# Patient Record
Sex: Female | Born: 1995 | Race: Black or African American | Hispanic: No | Marital: Single | Smoking: Current every day smoker
Health system: Southern US, Community
[De-identification: ages and names within clinical notes are randomized; demographics above are authoritative.]

---
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
## 2017-02-12 ENCOUNTER — Emergency Department (HOSPITAL_COMMUNITY): Payer: Self-pay

## 2017-02-12 ENCOUNTER — Emergency Department (HOSPITAL_COMMUNITY)
Admission: EM | Admit: 2017-02-12 | Discharge: 2017-02-12 | Disposition: A | Discharge status: Home / Self Care | Payer: Self-pay | Attending: Emergency Medicine | Admitting: Emergency Medicine

## 2017-02-12 ENCOUNTER — Encounter (HOSPITAL_COMMUNITY): Payer: Self-pay | Admitting: Emergency Medicine

## 2017-02-12 DIAGNOSIS — W108XXA Fall (on) (from) other stairs and steps, initial encounter: Secondary | ICD-10-CM | POA: Insufficient documentation

## 2017-02-12 DIAGNOSIS — Y999 Unspecified external cause status: Secondary | ICD-10-CM | POA: Insufficient documentation

## 2017-02-12 DIAGNOSIS — Y939 Activity, unspecified: Secondary | ICD-10-CM | POA: Insufficient documentation

## 2017-02-12 DIAGNOSIS — Y929 Unspecified place or not applicable: Secondary | ICD-10-CM | POA: Insufficient documentation

## 2017-02-12 DIAGNOSIS — S93401A Sprain of unspecified ligament of right ankle, initial encounter: Secondary | ICD-10-CM | POA: Insufficient documentation

## 2017-02-12 MED ORDER — IBUPROFEN 600 MG PO TABS: 600.0000 mg | ORAL_TABLET | Freq: Four times a day (QID) | ORAL | 0 refills | Status: DC

## 2017-02-12 MED ORDER — IBUPROFEN 800 MG PO TABS
800.0000 mg | ORAL_TABLET | Freq: Once | ORAL | Status: AC
  Administered 2017-02-12: 800 mg via ORAL
  Filled 2017-02-12: qty 1

## 2017-02-12 MED ORDER — ACETAMINOPHEN 325 MG PO TABS
650.0000 mg | ORAL_TABLET | Freq: Once | ORAL | Status: AC
  Administered 2017-02-12: 650 mg via ORAL
  Filled 2017-02-12: qty 2

## 2017-02-12 NOTE — ED Triage Notes (Signed)
Pt states she slipped down steps earlier today and now c/o right ankle pain.

## 2017-02-12 NOTE — ED Notes (Signed)
Pt returned from xray

## 2017-02-12 NOTE — ED Provider Notes (Signed)
[facility]-EMERGENCY DEPT Provider Note   CSN: [id number] Arrival date & time: 02/12/17  2004   By signing my name below, I, Bobbie Stack, attest that this documentation has been prepared under the direction and in the presence of Ivery Quale, PA-C. Electronically Signed: Bobbie Stack, Scribe. 02/12/17. 10:18 PM. History   Chief Complaint Chief Complaint  Patient presents with  . Ankle Pain    The history is provided by the patient. No language interpreter was used.  HPI Comments: Veronica Watts is a 21 y.o. female who presents to the Emergency Department complaining of right ankle pain s/p fall that occurred around 3 pm today. She states that she slipped down some steps and twisted her right ankle at that time. She reports pain with movement of her ankle. She is not on any blood thinners currently. She denies any recent surgeries or injuries to this ankle prior to today.   History reviewed. No pertinent past medical history.  There are no active problems to display for this patient.   History reviewed. No pertinent surgical history.  OB History    No data available       Home Medications    Prior to Admission medications   Medication Sig Start Date End Date Taking? Authorizing Provider  fluticasone (FLONASE) 50 MCG/ACT nasal spray Place 2 sprays into both nostrils daily. 11/24/13   Kathrynn Speed, PA-C  guaiFENesin (MUCINEX) 600 MG 12 hr tablet Take 2 tablets (1,200 mg total) by mouth 2 (two) times daily. 11/24/13   Kathrynn Speed, PA-C  ibuprofen (ADVIL,MOTRIN) 600 MG tablet Take 1 tablet (600 mg total) by mouth every 6 (six) hours as needed for pain. 06/21/13   Devoria Albe, MD  methocarbamol (ROBAXIN) 500 MG tablet Take 1 or 2 po Q 6hrs for chest wall pain 06/22/13   Devoria Albe, MD    Family History History reviewed. No pertinent family history.  Social History Social History  Substance Use Topics  . Smoking status: Never Smoker  . Smokeless tobacco: Never Used  .  Alcohol use No     Allergies   Patient has no known allergies.   Review of Systems Review of Systems  Constitutional: Negative for fever.  Gastrointestinal: Negative for abdominal pain, nausea and vomiting.  Musculoskeletal: Positive for arthralgias.       Right ankle pain  All other systems reviewed and are negative.    Physical Exam Updated Vital Signs BP (!) 121/53   Pulse 85   Temp 99.1 F (37.3 C)   Resp 18   Ht  (1.702 m)   Wt (!) 310 lb (140.6 kg)   LMP 02/12/2017   SpO2 96%   BMI 48.55 kg/m   Physical Exam  Constitutional: She is oriented to person, place, and time. She appears well-developed and well-nourished.  HENT:  Head: Normocephalic.  Eyes: EOM are normal.  Neck: Normal range of motion.  Pulmonary/Chest: Effort normal.  Abdominal: She exhibits no distension.  Musculoskeletal: Normal range of motion. She exhibits tenderness.  Capillary refill is < 2 seconds. Radial pulse 2+.  Achilles tendon intact. Swelling of lateral malleolus. Pain with flexion and extension of the right ankle. No deforemity of the calf or lower portion of the leg.  Neurological: She is alert and oriented to person, place, and time.  Psychiatric: She has a normal mood and affect.  Nursing note and vitals reviewed.   ED Treatments / Results  DIAGNOSTIC STUDIES: Oxygen Saturation is 96% on RA,  adequate by my interpretation.    COORDINATION OF CARE: 10:10 PM Discussed treatment plan with pt at bedside and pt agreed to plan. I discussed the patient's x-ray with her. I advised the patient to put ice on her ankle and elevate her ankle above her waistline.  Labs (all labs ordered are listed, but only abnormal results are displayed) Labs Reviewed - No data to display  EKG  EKG Interpretation None       Radiology Dg Ankle Complete Right  Result Date: 02/12/2017 CLINICAL DATA:  Status post fall, landing on right ankle. Right lateral ankle pain and swelling. Initial  encounter. EXAM: RIGHT ANKLE - COMPLETE 3+ VIEW COMPARISON:  None. FINDINGS: There is no evidence of fracture or dislocation. The ankle mortise is intact; the interosseous space is within normal limits. No talar tilt or subluxation is seen. An ankle joint effusion is noted. Lateral soft tissue swelling is noted. IMPRESSION: 1. No evidence of fracture or dislocation. 2. Ankle joint effusion noted. Electronically Signed   By: Roanna Raider M.D.   On: 02/12/2017 20:56    Procedures Procedures (including critical care time)  Medications Ordered in ED Medications - No data to display   Initial Impression / Assessment and Plan / ED Course  I have reviewed the triage vital signs and the nursing notes.  Pertinent labs & imaging results that were available during my care of the patient were reviewed by me and considered in my medical decision making (see chart for details).       Final Clinical Impressions(s) / ED Diagnoses MDM Patient sustained injury to the right ankle earlier today. She has pain and swelling that is worse when she attempts to walk on it. X-ray of the right ankle shows an ankle joint effusion, but no evidence of fracture or dislocation. Suspect the patient has a severe ankle sprain. Patient fitted with an ankle stirrup splint. Ice pack is been given. The patient will use ibuprofen and Tylenol every 6 hours for pain and discomfort. The patient will see Dr. Romeo Apple for additional evaluation if not improving.    Final diagnoses:  Sprain of right ankle, unspecified ligament, initial encounter    New Prescriptions New Prescriptions   No medications on file   **I personally performed the services described in this documentation, which was scribed in my presence. The recorded information has been reviewed and is accurate.Ivery Quale, PA-C 02/12/17 2239    Samuel Jester, DO 02/17/17 1120

## 2017-02-12 NOTE — Discharge Instructions (Signed)
Your x-ray is negative for fracture or dislocation. Your examination suggests a sprain of the right ankle. Please keep your ankle elevated above your waist is much as possible. Apply ice. Use  of Ibuprofen and  of tylenol every 6 hours for pain and discomfort. Use the ankle splint when up and about over the next 7-10 days. Please see Dr. Romeo Apple for orthopedic evaluation and management if not improving.

## 2017-05-15 ENCOUNTER — Encounter: Payer: Self-pay | Admitting: Obstetrics and Gynecology

## 2017-05-15 ENCOUNTER — Ambulatory Visit (INDEPENDENT_AMBULATORY_CARE_PROVIDER_SITE_OTHER): Payer: No Typology Code available for payment source | Admitting: Obstetrics and Gynecology

## 2017-05-15 DIAGNOSIS — N921 Excessive and frequent menstruation with irregular cycle: Secondary | ICD-10-CM

## 2017-05-15 DIAGNOSIS — Z3046 Encounter for surveillance of implantable subdermal contraceptive: Secondary | ICD-10-CM

## 2017-05-15 DIAGNOSIS — Z975 Presence of (intrauterine) contraceptive device: Principal | ICD-10-CM

## 2017-05-15 MED ORDER — ESTRADIOL 0.1 MG/GM VA CREA: 0.2500 | TOPICAL_CREAM | VAGINAL | 3 refills | Status: DC

## 2017-05-15 MED ORDER — ESTRADIOL 1 MG PO TABS: 1.0000 mg | ORAL_TABLET | Freq: Two times a day (BID) | ORAL | 2 refills | Status: DC

## 2017-05-15 NOTE — Progress Notes (Signed)
   [facility] Visit  05/15/2017            Patient name: Veronica Watts MRN [id number]  Date of birth: [birth year]  CC & HPI:  Veronica Watts is a 21 y.o. female presenting today for heavy bleeding on nexplanon BC with onset of recently. Pt [facility], with no bleeding. Pt states she has heavy bleeding that begins when she goes to work, and it stops when she comes homes. Pt experiences similar bleeding occasionally after sexual intercourse.   ROS:  ROS +heavy vaginal bleeding -fever  Pertinent History Reviewed:   Reviewed:  Medical        History reviewed. No pertinent past medical history.                            Surgical Hx:   History reviewed. No pertinent surgical history. Medications: Reviewed & Updated - see associated section                      No current outpatient prescriptions on file.   Social History: Reviewed -  reports that she has never smoked. She has never used smokeless tobacco.  Objective Findings:  Vitals: Blood pressure 132/78, pulse 78, height 5\' 8"  (1.727 m), weight (!) 347 lb (157.4 kg), last menstrual period 04/11/2017.  Physical Examination: General appearance - alert, well appearing, and in no distress Mental status - alert, oriented to person, place, and time Pelvic -  VULVA: normal appearing vulva with no masses, tenderness or lesions,  VAGINA: normal appearing vagina with normal color and discharge, no lesions,  CERVIX: normal appearing cervix without discharge or lesions, no irritation UTERUS: uterus is normal size, shape, consistency and nontender,  ADNEXA: normal adnexa in size, nontender and no masses   Assessment & Plan:   A:  1. Breakthrough bleeding on Nexplanon  P:  1. Estradiol 1 mg 2x/day while spotting, take until spotting stops 2. F/u in 3 months    By signing my name below, I, Izna Ahmed, attest that this documentation has been prepared under the direction and in the presence of  Tilda BurrowFerguson, Wetzel Meester V, MD. Electronically Signed: Redge GainerIzna Ahmed, ED Scribe. 05/15/17. 2:30 PM.  I personally performed the services described in this documentation, which was SCRIBED in my presence. The recorded information has been reviewed and considered accurate. It has been edited as necessary during review. Tilda BurrowFERGUSON,Eulamae Greenstein V, MD

## 2017-06-15 ENCOUNTER — Emergency Department (HOSPITAL_COMMUNITY)
Admission: EM | Admit: 2017-06-15 | Discharge: 2017-06-16 | Disposition: A | Discharge status: Home / Self Care | Payer: PRIVATE HEALTH INSURANCE | Attending: Emergency Medicine | Admitting: Emergency Medicine

## 2017-06-15 ENCOUNTER — Encounter (HOSPITAL_COMMUNITY): Payer: Self-pay | Admitting: *Deleted

## 2017-06-15 ENCOUNTER — Emergency Department (HOSPITAL_COMMUNITY): Payer: PRIVATE HEALTH INSURANCE

## 2017-06-15 DIAGNOSIS — Y929 Unspecified place or not applicable: Secondary | ICD-10-CM | POA: Diagnosis not present

## 2017-06-15 DIAGNOSIS — Y939 Activity, unspecified: Secondary | ICD-10-CM | POA: Diagnosis not present

## 2017-06-15 DIAGNOSIS — S93402A Sprain of unspecified ligament of left ankle, initial encounter: Secondary | ICD-10-CM | POA: Diagnosis not present

## 2017-06-15 DIAGNOSIS — S99912A Unspecified injury of left ankle, initial encounter: Secondary | ICD-10-CM | POA: Diagnosis present

## 2017-06-15 DIAGNOSIS — W010XXA Fall on same level from slipping, tripping and stumbling without subsequent striking against object, initial encounter: Secondary | ICD-10-CM | POA: Insufficient documentation

## 2017-06-15 DIAGNOSIS — Y999 Unspecified external cause status: Secondary | ICD-10-CM | POA: Diagnosis not present

## 2017-06-15 MED ORDER — IBUPROFEN 600 MG PO TABS: 600.0000 mg | ORAL_TABLET | Freq: Four times a day (QID) | ORAL | 0 refills | Status: DC | PRN

## 2017-06-15 NOTE — Discharge Instructions (Signed)
Wear the ASO and use crutches to avoid weight bearing.  Use ice and elevation as much as possible for the next several days to help reduce the swelling.   Use the ibuprofen for inflammation and pain.  Call the orthopedic doctor listed for a recheck of your injury if not improving over the next 2 weeks.  You may benefit from physical therapy of your ankle if it is not improving by then.

## 2017-06-15 NOTE — ED Triage Notes (Signed)
Pt reports falling down 2 steps and injuring her left ankle and landed on her left knee. Pt denies any other injury.

## 2017-06-16 DIAGNOSIS — S93402A Sprain of unspecified ligament of left ankle, initial encounter: Secondary | ICD-10-CM | POA: Diagnosis not present

## 2017-06-16 MED ORDER — TRAMADOL HCL 50 MG PO TABS: 50.0000 mg | ORAL_TABLET | Freq: Four times a day (QID) | ORAL | 0 refills | Status: DC | PRN

## 2017-06-16 MED ORDER — TRAMADOL HCL 50 MG PO TABS
50.0000 mg | ORAL_TABLET | Freq: Once | ORAL | Status: AC
  Administered 2017-06-16: 50 mg via ORAL
  Filled 2017-06-16: qty 1

## 2017-06-16 NOTE — ED Provider Notes (Signed)
[facility]-EMERGENCY DEPT Provider Note   CSN: [id number] Arrival date & time: 06/15/17  2141     History   Chief Complaint Chief Complaint  Patient presents with  . Ankle Pain    HPI Veronica Watts is a 20 y.o. female presenting with acute ankle pain which occurred suddenly when the patient tripped and fell going down 2 steps causing inversion injury to her left ankle and landing on her knee.  She denies knee pain.  Ankle  pain is aching, constant and worse with palpation, movement and weight bearing.  The patient was not able to weight bear immediately after the event.  There is no radiation of pain and the patient denies numbness distal to the injury site.  The patient had no treatment prior to arrival.   The history is provided by the patient.    History reviewed. No pertinent past medical history.  Patient Active Problem List   Diagnosis Date Noted  . Breakthrough bleeding on Nexplanon 05/15/2017    History reviewed. No pertinent surgical history.  OB History    Gravida Para Term Preterm AB Living   0 0 0 0 0 0   SAB TAB Ectopic Multiple Live Births   0 0 0 0 0       Home Medications    Prior to Admission medications   Medication Sig Start Date End Date Taking? Authorizing Provider  estradiol (ESTRACE) 1 MG tablet Take 1 tablet (1 mg total) by mouth 2 (two) times daily. On days of spotting on Nexplanon 05/15/17 05/15/18 Yes Tilda Burrow, MD  etonogestrel (NEXPLANON) 68 MG IMPL implant 1 each by Subdermal route once.   Yes [provider]  ibuprofen (ADVIL,MOTRIN) 600 MG tablet Take 1 tablet (600 mg total) by mouth every 6 (six) hours as needed. 06/15/17   Burgess Amor, PA-C  traMADol (ULTRAM) 50 MG tablet Take 1 tablet (50 mg total) by mouth every 6 (six) hours as needed. 06/16/17   Burgess Amor, PA-C    Family History Family History  Problem Relation Age of Onset  . Diabetes Paternal Grandmother   . Diabetes Maternal Grandmother   . Cancer Maternal  Grandmother        throat  . Diabetes Father     Social History Social History  Substance Use Topics  . Smoking status: Never Smoker  . Smokeless tobacco: Never Used  . Alcohol use No     Allergies   Patient has no known allergies.   Review of Systems Review of Systems  Musculoskeletal: Positive for arthralgias and joint swelling.  Skin: Negative for wound.  Neurological: Negative for weakness and numbness.     Physical Exam Updated Vital Signs BP 114/68 (BP Location: Left Arm)   Pulse 86   Temp 99.3 F (37.4 C) (Oral)   Resp 18   Ht 5\' 7"  (1.702 m)   Wt (!) 160.6 kg (354 lb 1 oz)   LMP 06/13/2017   SpO2 100%   BMI 55.45 kg/m   Physical Exam  Constitutional: She appears well-developed and well-nourished.  HENT:  Head: Normocephalic.  Cardiovascular: Normal rate and intact distal pulses.  Exam reveals no decreased pulses.   Pulses:      Dorsalis pedis pulses are 2+ on the right side, and 2+ on the left side.       Posterior tibial pulses are 2+ on the right side, and 2+ on the left side.  Musculoskeletal: She exhibits edema and tenderness.  Left ankle: She exhibits decreased range of motion, swelling and ecchymosis. She exhibits no deformity and normal pulse. Tenderness. Lateral malleolus tenderness found. No head of 5th metatarsal and no proximal fibula tenderness found. Achilles tendon normal.  Neurological: She is alert. No sensory deficit.  Skin: Skin is warm, dry and intact.  Nursing note and vitals reviewed.    ED Treatments / Results  Labs (all labs ordered are listed, but only abnormal results are displayed) Labs Reviewed - No data to display  EKG  EKG Interpretation None       Radiology Dg Ankle Complete Left  Result Date: 06/15/2017 CLINICAL DATA:  Left ankle pain after injury.  Fall down steps. EXAM: LEFT ANKLE COMPLETE - 3+ VIEW COMPARISON:  None. FINDINGS: There is no evidence of fracture, dislocation, or joint effusion. Ankle  mortise is preserved. There is no evidence of arthropathy or other focal bone abnormality. Soft tissue edema versus habitus. IMPRESSION: No fracture or subluxation of the left ankle. Electronically Signed   By: Rubye Oaks M.D.   On: 06/15/2017 22:56    Procedures Procedures (including critical care time)  Medications Ordered in ED Medications  traMADol (ULTRAM) tablet 50 mg (50 mg Oral Given 06/16/17 0028)     Initial Impression / Assessment and Plan / ED Course  I have reviewed the triage vital signs and the nursing notes.  Pertinent labs & imaging results that were available during my care of the patient were reviewed by me and considered in my medical decision making (see chart for details).     RICE, aso, crutches.  Pt referred to ortho for a recheck in 1-2 weeks if not improving. Discussed ice tx initially followed by contrast baths, aso, prn crutches.   Final Clinical Impressions(s) / ED Diagnoses   Final diagnoses:  Sprain of left ankle, unspecified ligament, initial encounter    New Prescriptions New Prescriptions   IBUPROFEN (ADVIL,MOTRIN) 600 MG TABLET    Take 1 tablet (600 mg total) by mouth every 6 (six) hours as needed.   TRAMADOL (ULTRAM) 50 MG TABLET    Take 1 tablet (50 mg total) by mouth every 6 (six) hours as needed.     Burgess Amor, PA-C 06/16/17 0038    Samuel Jester, DO 06/16/17 2255

## 2017-06-25 ENCOUNTER — Ambulatory Visit (INDEPENDENT_AMBULATORY_CARE_PROVIDER_SITE_OTHER): Payer: PRIVATE HEALTH INSURANCE | Admitting: Orthopaedic Surgery

## 2017-06-25 ENCOUNTER — Encounter: Payer: Self-pay | Admitting: Orthopaedic Surgery

## 2017-06-25 VITALS — BP 116/76 | HR 77 | Temp 98.8°F | Ht 66.0 in | Wt 347.0 lb

## 2017-06-25 DIAGNOSIS — S96912A Strain of unspecified muscle and tendon at ankle and foot level, left foot, initial encounter: Secondary | ICD-10-CM | POA: Diagnosis not present

## 2017-06-25 NOTE — Progress Notes (Signed)
Subjective:    Patient ID: Veronica Watts, female    DOB: 1996/04/18, 21 y.o.   MRN: [id number]  HPI She twisted her left ankle on 06-15-17. She was seen in the ER.  X-rays were negative for fracture. She has ankle brace in place.  She has no other injury.  She is a little better.  She is working.  She has taken Advil and used ice both which help.   Review of Systems  HENT: Negative for congestion.   Respiratory: Negative for cough and shortness of breath.   Cardiovascular: Negative for chest pain and leg swelling.  Endocrine: Negative for cold intolerance.  Musculoskeletal: Positive for arthralgias, gait problem and joint swelling.  Allergic/Immunologic: Negative for environmental allergies.   History reviewed. No pertinent past medical history.  History reviewed. No pertinent surgical history.  Current Outpatient Prescriptions on File Prior to Visit  Medication Sig Dispense Refill  . estradiol (ESTRACE) 1 MG tablet Take 1 tablet (1 mg total) by mouth 2 (two) times daily. On days of spotting on Nexplanon 30 tablet 2  . etonogestrel (NEXPLANON) 68 MG IMPL implant 1 each by Subdermal route once.    Marland Kitchen ibuprofen (ADVIL,MOTRIN) 600 MG tablet Take 1 tablet (600 mg total) by mouth every 6 (six) hours as needed. 30 tablet 0  . traMADol (ULTRAM) 50 MG tablet Take 1 tablet (50 mg total) by mouth every 6 (six) hours as needed. 15 tablet 0   No current facility-administered medications on file prior to visit.     Social History   Social History  . Marital status: Single    Spouse name: N/A  . Number of children: N/A  . Years of education: N/A   Occupational History  . Not on file.   Social History Main Topics  . Smoking status: Never Smoker  . Smokeless tobacco: Never Used  . Alcohol use No  . Drug use: No  . Sexual activity: Yes    Birth control/ protection: Implant   Other Topics Concern  . Not on file   Social History Narrative  . No narrative on file    Family  History  Problem Relation Age of Onset  . Diabetes Paternal Grandmother   . Diabetes Maternal Grandmother   . Cancer Maternal Grandmother        throat  . Diabetes Father     BP 116/76   Pulse 77   Temp 98.8 F (37.1 C)   Ht 5\' 6"  (1.676 m)   Wt (!) 347 lb (157.4 kg)   LMP 06/13/2017   BMI 56.01 kg/m      Objective:   Physical Exam  Constitutional: She is oriented to person, place, and time. She appears well-developed and well-nourished.  HENT:  Head: Normocephalic and atraumatic.  Eyes: Pupils are equal, round, and reactive to light. Conjunctivae and EOM are normal.  Neck: Normal range of motion. Neck supple.  Cardiovascular: Normal rate, regular rhythm and intact distal pulses.   Pulmonary/Chest: Effort normal.  Abdominal: Soft.  Musculoskeletal: She exhibits tenderness (left ankle swelling laterally with pain over the anterior talofibular ligament, ROM full but tender, limp left, brace left; right ankle negative.).  Neurological: She is alert and oriented to person, place, and time. She displays normal reflexes. No cranial nerve deficit. She exhibits normal muscle tone. Coordination normal.  Skin: Skin is warm and dry.  Psychiatric: She has a normal mood and affect. Her behavior is normal. Judgment and thought content normal.  Vitals  reviewed.         Assessment & Plan:   Encounter Diagnosis  Name Primary?  . Strain of left ankle, initial encounter Yes   I have reviewed her ER records, X-rays and reports.    I have given contrast bath sheet of instructions to do.  Use ice, elevate, continue ankle brace.  Return in two weeks.  Call if any problem.  Precautions discussed.   Electronically Signed Darreld McleanWayne Donold Marotto, MD 9/4/20182:50 PM

## 2017-07-10 ENCOUNTER — Ambulatory Visit: Payer: PRIVATE HEALTH INSURANCE | Admitting: Orthopaedic Surgery

## 2017-08-12 ENCOUNTER — Ambulatory Visit (INDEPENDENT_AMBULATORY_CARE_PROVIDER_SITE_OTHER): Payer: Medicaid Other | Admitting: Obstetrics and Gynecology

## 2017-08-12 ENCOUNTER — Encounter: Payer: Self-pay | Admitting: Obstetrics and Gynecology

## 2017-08-12 VITALS — BP 112/62 | HR 82 | Ht 67.0 in | Wt 355.0 lb

## 2017-08-12 DIAGNOSIS — Z3049 Encounter for surveillance of other contraceptives: Secondary | ICD-10-CM | POA: Diagnosis not present

## 2017-08-12 DIAGNOSIS — Z3040 Encounter for surveillance of contraceptives, unspecified: Secondary | ICD-10-CM | POA: Insufficient documentation

## 2017-08-12 NOTE — Progress Notes (Signed)
Patient ID: Veronica Watts, female   DOB: 1995-11-07, 21 y.o.   MRN: [id number]   [facility]  @[facility] @            Patient name: Veronica BachelorDeasia S Watts MRN [id number]  Date of birth: 1995-11-07  CC & HPI:  Veronica Watts is a 21 y.o. female presenting today for f/u regarding breakthrough bleeding on Nexplanon. She reports that the blood has become darker and she was concerned of a possible infection. Pt has been sexually active with a female for the last year. She has mainly been using the Nexplanon for period control. Pt notes that her period has been under control. Pt denies any fever, chills or any other symptoms at time time.  ROS:  ROS +dark red vaginal bleeding -fever -chills All systems are negative except as noted in the HPI and PMH.   Pertinent History Reviewed:   Reviewed:  Medical        History reviewed. No pertinent past medical history.                            Surgical Hx:   History reviewed. No pertinent surgical history. Medications: Reviewed & Updated - see associated section                       Current Outpatient Prescriptions:  .  estradiol (ESTRACE) 1 MG tablet, Take 1 tablet (1 mg total) by mouth 2 (two) times daily. On days of spotting on Nexplanon, Disp: 30 tablet, Rfl: 2 .  etonogestrel (NEXPLANON) 68 MG IMPL implant, 1 each by Subdermal route once., Disp: , Rfl:  .  ibuprofen (ADVIL,MOTRIN) 600 MG tablet, Take 1 tablet (600 mg total) by mouth every 6 (six) hours as needed., Disp: 30 tablet, Rfl: 0 .  traMADol (ULTRAM) 50 MG tablet, Take 1 tablet (50 mg total) by mouth every 6 (six) hours as needed. (Patient not taking: Reported on 08/12/2017), Disp: 15 tablet, Rfl: 0   Social History: Reviewed -  reports that she has never smoked. She has never used smokeless tobacco.  Objective Findings:  Vitals: Blood pressure 112/62, pulse 82, height 5\' 7"  (1.702 m), weight (!) 355 lb (161 kg), SpO2 98 %.  Physical Examination: General appearance -  alert, well appearing, and in no distress, oriented to person, place, and time and overweight, piercing in place in left nostril. Mental status - alert, oriented to person, place, and time, normal mood, behavior, speech, dress, motor activity, and thought processes, affect appropriate to mood Abdomen- non tender Breast and Pelvic - declined by patient  Assessment & Plan:   A:  1. Contraception management 2. Normal menstrual cycle on Nexplanon  P:  1. F/U PRN   By signing my name below, I, Diona BrownerJennifer Gorman, attest that this documentation has been prepared under the direction and in the presence of Tilda BurrowFerguson, Calyb Mcquarrie V, MD. Electronically Signed: Diona BrownerJennifer Gorman, Medical Scribe. 08/12/17. 3:57 PM.  I personally performed the services described in this documentation, which was SCRIBED in my presence. The recorded information has been reviewed and considered accurate. It has been edited as necessary during review. Tilda BurrowFERGUSON,Shenique Childers V, MD

## 2017-08-15 ENCOUNTER — Ambulatory Visit: Payer: No Typology Code available for payment source | Admitting: Obstetrics and Gynecology

## 2018-07-17 ENCOUNTER — Emergency Department (HOSPITAL_COMMUNITY): Payer: Self-pay

## 2018-07-17 ENCOUNTER — Other Ambulatory Visit: Payer: Self-pay

## 2018-07-17 ENCOUNTER — Encounter (HOSPITAL_COMMUNITY): Payer: Self-pay | Admitting: Emergency Medicine

## 2018-07-17 ENCOUNTER — Emergency Department (HOSPITAL_COMMUNITY)
Admission: EM | Admit: 2018-07-17 | Discharge: 2018-07-17 | Disposition: A | Discharge status: Home / Self Care | Payer: Self-pay | Attending: Emergency Medicine | Admitting: Emergency Medicine

## 2018-07-17 DIAGNOSIS — N762 Acute vulvitis: Secondary | ICD-10-CM | POA: Insufficient documentation

## 2018-07-17 DIAGNOSIS — R102 Pelvic and perineal pain: Secondary | ICD-10-CM | POA: Insufficient documentation

## 2018-07-17 DIAGNOSIS — B9689 Other specified bacterial agents as the cause of diseases classified elsewhere: Secondary | ICD-10-CM

## 2018-07-17 DIAGNOSIS — N760 Acute vaginitis: Secondary | ICD-10-CM

## 2018-07-17 LAB — BASIC METABOLIC PANEL
Anion gap: 6 (ref 5–15)
BUN: 7 mg/dL (ref 6–20)
CALCIUM: 8.8 mg/dL — AB (ref 8.9–10.3)
CO2: 26 mmol/L (ref 22–32)
CREATININE: 0.57 mg/dL (ref 0.44–1.00)
Chloride: 101 mmol/L (ref 98–111)
GFR calc non Af Amer: >60 mL/min (ref >60)
Glucose, Bld: 92 mg/dL (ref 70–99)
Potassium: 3.6 mmol/L (ref 3.5–5.1)
SODIUM: 133 mmol/L — AB (ref 135–145)

## 2018-07-17 LAB — CBC WITH DIFFERENTIAL/PLATELET
Basophils Absolute: 0 10*3/uL (ref 0.0–0.1)
Basophils Relative: 0 %
EOS ABS: 0.1 10*3/uL (ref 0.0–0.7)
EOS PCT: 1 %
HCT: 38.9 % (ref 36.0–46.0)
HEMOGLOBIN: 12.7 g/dL (ref 12.0–15.0)
Lymphocytes Relative: 31 %
Lymphs Abs: 2.1 10*3/uL (ref 0.7–4.0)
MCH: 29.4 pg (ref 26.0–34.0)
MCHC: 32.6 g/dL (ref 30.0–36.0)
MCV: 90 fL (ref 78.0–100.0)
Monocytes Absolute: 0.5 10*3/uL (ref 0.1–1.0)
Monocytes Relative: 8 %
NEUTROS PCT: 60 %
Neutro Abs: 4.2 10*3/uL (ref 1.7–7.7)
PLATELETS: 395 10*3/uL (ref 150–400)
RBC: 4.32 MIL/uL (ref 3.87–5.11)
RDW: 13 % (ref 11.5–15.5)
WBC: 6.9 10*3/uL (ref 4.0–10.5)

## 2018-07-17 LAB — URINALYSIS, ROUTINE W REFLEX MICROSCOPIC
BILIRUBIN URINE: NEGATIVE
Glucose, UA: NEGATIVE mg/dL
Hgb urine dipstick: NEGATIVE
KETONES UR: NEGATIVE mg/dL
Leukocytes, UA: NEGATIVE
Nitrite: NEGATIVE
PH: 8 (ref 5.0–8.0)
Protein, ur: NEGATIVE mg/dL
SPECIFIC GRAVITY, URINE: 1.014 (ref 1.005–1.030)

## 2018-07-17 LAB — PREGNANCY, URINE: Preg Test, Ur: NEGATIVE

## 2018-07-17 LAB — WET PREP, GENITAL
SPERM: NONE SEEN
TRICH WET PREP: NONE SEEN
Yeast Wet Prep HPF POC: NONE SEEN

## 2018-07-17 MED ORDER — METRONIDAZOLE 500 MG PO TABS: 500.0000 mg | ORAL_TABLET | Freq: Two times a day (BID) | ORAL | 0 refills | Status: DC

## 2018-07-17 MED ORDER — IOPAMIDOL (ISOVUE-300) INJECTION 61%
100.0000 mL | Freq: Once | INTRAVENOUS | Status: AC | PRN
  Administered 2018-07-17: 100 mL via INTRAVENOUS

## 2018-07-17 NOTE — ED Provider Notes (Signed)
[facility] EMERGENCY DEPARTMENT Provider Note   CSN: [id number] Arrival date & time: 07/17/18  4098     History   Chief Complaint Chief Complaint  Patient presents with  . Pelvic Pain    HPI Veronica Watts is a 22 y.o. female.  Patient with onset of suprapubic abdominal pain pelvic pain at 4 this morning.  Not associated with nausea or vomiting.  Patient is never had pain like this before.  No discharge no vaginal bleeding.  No back pain.     History reviewed. No pertinent past medical history.  Patient Active Problem List   Diagnosis Date Noted  . Encounter for surveillance of contraceptives 08/12/2017  . Breakthrough bleeding on Nexplanon 05/15/2017    History reviewed. No pertinent surgical history.   OB History    Gravida  0   Para  0   Term  0   Preterm  0   AB  0   Living  0     SAB  0   TAB  0   Ectopic  0   Multiple  0   Live Births  0            Home Medications    Prior to Admission medications   Medication Sig Start Date End Date Taking? Authorizing Provider  ibuprofen (ADVIL,MOTRIN) 600 MG tablet Take 1 tablet (600 mg total) by mouth every 6 (six) hours as needed. 06/15/17   Burgess Amor, PA-C  metroNIDAZOLE (FLAGYL) 500 MG tablet Take 1 tablet (500 mg total) by mouth 2 (two) times daily. 07/17/18   Vanetta Mulders, MD    Family History Family History  Problem Relation Age of Onset  . Diabetes Paternal Grandmother   . Diabetes Maternal Grandmother   . Cancer Maternal Grandmother        throat  . Diabetes Father     Social History Social History   Tobacco Use  . Smoking status: Never Smoker  . Smokeless tobacco: Never Used  Substance Use Topics  . Alcohol use: No  . Drug use: No     Allergies   Patient has no known allergies.   Review of Systems Review of Systems  Constitutional: Negative for fever.  HENT: Negative for congestion.   Eyes: Negative for redness.  Respiratory: Negative for shortness of  breath.   Cardiovascular: Negative for chest pain.  Gastrointestinal: Negative for abdominal pain, nausea and vomiting.  Genitourinary: Positive for pelvic pain. Negative for dysuria, flank pain, genital sores, hematuria, vaginal bleeding, vaginal discharge and vaginal pain.  Musculoskeletal: Negative for back pain.  Skin: Negative for rash.  Neurological: Negative for syncope and headaches.  Hematological: Does not bruise/bleed easily.  Psychiatric/Behavioral: Negative for confusion.     Physical Exam Updated Vital Signs BP 118/69 (BP Location: Right Arm)   Pulse 78   Temp 98.5 F (36.9 C) (Oral)   Resp 16   Ht 1.702 m (5\' 7" )   Wt (!) 147.4 kg   LMP 06/11/2018   SpO2 97%   BMI 50.90 kg/m   Physical Exam  Constitutional: She is oriented to person, place, and time. She appears well-developed and well-nourished. No distress.  HENT:  Head: Normocephalic and atraumatic.  Mouth/Throat: Oropharynx is clear and moist.  Eyes: Pupils are equal, round, and reactive to light. Conjunctivae and EOM are normal.  Neck: Neck supple.  Cardiovascular: Normal rate, regular rhythm and normal heart sounds.  Pulmonary/Chest: Effort normal and breath sounds normal. No respiratory distress.  Abdominal: Soft. Bowel sounds are normal. There is no tenderness.  Genitourinary:  Genitourinary Comments: Genitalia was normal.  Vaginal vault with a slight whitish discharge no cervical motion tenderness.  Uterus with some mild tenderness but we were palpating over the suprapubic area.  No obvious adnexal tenderness.  Musculoskeletal: Normal range of motion. She exhibits no edema.  Neurological: She is alert and oriented to person, place, and time.  Skin: Skin is warm.  Nursing note and vitals reviewed.    ED Treatments / Results  Labs (all labs ordered are listed, but only abnormal results are displayed) Labs Reviewed  WET PREP, GENITAL - Abnormal; Notable for the following components:      Result  Value   Clue Cells Wet Prep HPF POC PRESENT (*)    WBC, Wet Prep HPF POC MANY (*)    All other components within normal limits  BASIC METABOLIC PANEL - Abnormal; Notable for the following components:   Sodium 133 (*)    Calcium 8.8 (*)    All other components within normal limits  URINALYSIS, ROUTINE W REFLEX MICROSCOPIC  PREGNANCY, URINE  CBC WITH DIFFERENTIAL/PLATELET  RPR  HIV ANTIBODY (ROUTINE TESTING W REFLEX)  GC/CHLAMYDIA PROBE AMP ([place]) NOT AT [facility]    EKG None  Radiology Ct Abdomen Pelvis W Contrast  Result Date: 07/17/2018 CLINICAL DATA:  Low pelvic pain over the last 24 hours EXAM: CT ABDOMEN AND PELVIS WITH CONTRAST TECHNIQUE: Multidetector CT imaging of the abdomen and pelvis was performed using the standard protocol following bolus administration of intravenous contrast. CONTRAST:  ISOVUE-300 IOPAMIDOL (ISOVUE-300) INJECTION 61% COMPARISON:  None. FINDINGS: Lower chest: The lung bases are clear. The heart is within normal limits in size. Hepatobiliary: The liver enhances with no focal abnormality and no ductal dilatation is seen. No calcified gallstones are seen. Pancreas: The pancreas is well seen with no abnormality noted. The pancreatic duct is not dilated. Spleen: The spleen is unremarkable. Adrenals/Urinary Tract: The adrenal glands appear normal. The kidneys enhance and no calculus is seen. There is no evidence of renal mass and no hydronephrosis is evident. The ureters are normal in caliber. The urinary bladder is moderately distended with no abnormality noted. Stomach/Bowel: The stomach is decompressed but no obvious abnormality is seen. No small bowel dilatation or edema is seen. The colon is largely decompressed. The terminal ileum is unremarkable. The appendix is difficult to visualize probably extending inferiorly and medially but no inflammatory process is seen. Vascular/Lymphatic: The abdominal aorta is normal in caliber. No adenopathy is seen.  Reproductive: The uterus is normal in size. There are fibroid bilateral ovarian follicles present but only a tiny amount of free fluid is noted within the pelvis. Other: No abdominal wall hernia is seen. Musculoskeletal: The lumbar vertebrae are in normal alignment with normal intervertebral disc spaces. IMPRESSION: 1. No definite explanation for the patient's pain is seen. There are ovarian follicles present but no significant free fluid is noted. 2. No renal or ureteral calculi are evident. No hydronephrosis is seen. 3. No inflammatory process is noted within the right lower quadrant although the appendix is not optimally visualized. Electronically Signed   By: Dwyane Dee M.D.   On: 07/17/2018 16:34    Procedures Procedures (including critical care time)  Medications Ordered in ED Medications  iopamidol (ISOVUE-300) 61 % injection 100 mL (100 mLs Intravenous Contrast Given 07/17/18 1611)     Initial Impression / Assessment and Plan / ED Course  I have reviewed the  triage vital signs and the nursing notes.  Pertinent labs & imaging results that were available during my care of the patient were reviewed by me and considered in my medical decision making (see chart for details).    Patient presenting with pelvic pain suprapubic in nature.  No dysuria no vaginal bleeding denies any vaginal discharge.  Never had pain like this before.  It started at actually 4:00 this morning.  Also not associated back pain.  Pelvic exam with a whitish discharge no cervical motion tenderness.  Some mild uterine tenderness but not consistent with significant PID.  Patient's pelvic cultures are pending.  Wet prep positive for clue cells.  Based on this patient will be treated with a course of Flagyl.  Not certain if the suprapubic pain is related to this.  Patient given referral to [facility] with [facility] as well.  Patient knows that she will be notified if the pelvic cultures are positive.  Patient stable  at time of discharge no acute distress.  Labs showed no evidence of pregnancy urinalysis negative no leukocytosis electrolytes with some mild hyponatremia with a sodium of 133.  Renal function was normal.  Because the pelvic was not clinically significant CT scan of the abdomen was done which had no significant abnormalities.   Final Clinical Impressions(s) / ED Diagnoses   Final diagnoses:  Pelvic pain in female  BV (bacterial vaginosis)    ED Discharge Orders         Ordered    metroNIDAZOLE (FLAGYL) 500 MG tablet  2 times daily     07/17/18 1659           Vanetta Mulders, MD 07/17/18 2201

## 2018-07-17 NOTE — ED Notes (Signed)
C/o sharp lower abdominal pain during voiding and changing position.  At rest pain 0/10 and 10/10 when pain hits.

## 2018-07-17 NOTE — ED Notes (Signed)
Pt and visitor began cussing and arguing with one another. Security asked visitor to leave

## 2018-07-17 NOTE — Discharge Instructions (Signed)
At today's work-up including CT of the abdomen without any significant findings.  Pelvic was done along with pelvic cultures will be notified if the cultures turn positive.  The wet prep was consistent with bacterial vaginosis also take the antibiotic Flagyl as directed.  Make an appointment to follow-up with [facility] for further evaluation.  Work note provided.

## 2018-07-17 NOTE — ED Triage Notes (Signed)
Onset yesterday, pelvic pain, voiding is not a problem, denies vaginal bleeding.

## 2018-07-18 LAB — HIV ANTIBODY (ROUTINE TESTING W REFLEX): HIV Screen 4th Generation wRfx: NONREACTIVE

## 2018-07-18 LAB — RPR: RPR Ser Ql: NONREACTIVE

## 2018-07-18 LAB — GC/CHLAMYDIA PROBE AMP (~~LOC~~) NOT AT ARMC
CHLAMYDIA, DNA PROBE: NEGATIVE
Neisseria Gonorrhea: NEGATIVE

## 2019-09-16 ENCOUNTER — Encounter (HOSPITAL_COMMUNITY): Payer: Self-pay

## 2019-09-16 ENCOUNTER — Emergency Department (HOSPITAL_COMMUNITY): Payer: Self-pay

## 2019-09-16 ENCOUNTER — Other Ambulatory Visit: Payer: Self-pay

## 2019-09-16 ENCOUNTER — Emergency Department (HOSPITAL_COMMUNITY)
Admission: EM | Admit: 2019-09-16 | Discharge: 2019-09-16 | Disposition: A | Discharge status: Home / Self Care | Payer: Self-pay | Attending: Emergency Medicine | Admitting: Emergency Medicine

## 2019-09-16 DIAGNOSIS — Z79899 Other long term (current) drug therapy: Secondary | ICD-10-CM | POA: Insufficient documentation

## 2019-09-16 DIAGNOSIS — N898 Other specified noninflammatory disorders of vagina: Secondary | ICD-10-CM | POA: Insufficient documentation

## 2019-09-16 DIAGNOSIS — N920 Excessive and frequent menstruation with regular cycle: Secondary | ICD-10-CM | POA: Insufficient documentation

## 2019-09-16 DIAGNOSIS — N390 Urinary tract infection, site not specified: Secondary | ICD-10-CM | POA: Insufficient documentation

## 2019-09-16 LAB — URINALYSIS, ROUTINE W REFLEX MICROSCOPIC
Bilirubin Urine: NEGATIVE
Glucose, UA: NEGATIVE mg/dL
Ketones, ur: NEGATIVE mg/dL
Leukocytes,Ua: NEGATIVE
Nitrite: POSITIVE — AB
Protein, ur: NEGATIVE mg/dL
Specific Gravity, Urine: 1.026 (ref 1.005–1.030)
pH: 6 (ref 5.0–8.0)

## 2019-09-16 LAB — I-STAT CHEM 8, ED
BUN: 11 mg/dL (ref 6–20)
Calcium, Ion: 1.2 mmol/L (ref 1.15–1.40)
Chloride: 104 mmol/L (ref 98–111)
Creatinine, Ser: 0.6 mg/dL (ref 0.44–1.00)
Glucose, Bld: 101 mg/dL — ABNORMAL HIGH (ref 70–99)
HCT: 38 % (ref 36.0–46.0)
Hemoglobin: 12.9 g/dL (ref 12.0–15.0)
Potassium: 3.6 mmol/L (ref 3.5–5.1)
Sodium: 140 mmol/L (ref 135–145)
TCO2: 24 mmol/L (ref 22–32)

## 2019-09-16 LAB — WET PREP, GENITAL
Sperm: NONE SEEN
Trich, Wet Prep: NONE SEEN
Yeast Wet Prep HPF POC: NONE SEEN

## 2019-09-16 LAB — I-STAT BETA HCG BLOOD, ED (MC, WL, AP ONLY): I-stat hCG, quantitative: <5 m[IU]/mL (ref <5)

## 2019-09-16 MED ORDER — CEPHALEXIN 500 MG PO CAPS: ORAL_CAPSULE | ORAL | 0 refills | Status: DC

## 2019-09-16 MED ORDER — NAPROXEN 375 MG PO TABS: 375.0000 mg | ORAL_TABLET | Freq: Two times a day (BID) | ORAL | 0 refills | Status: DC

## 2019-09-16 NOTE — Discharge Instructions (Addendum)
Get help right away if: You soak through more than a pad or tampon in 1 hour. You pass out. You feel short of breath. You feel like your heart is beating too fast. You feel dizzy or faint. You feel very weak or tired. Severe pain in your back or your lower abdomen. A fever. Nausea or vomiting.

## 2019-09-16 NOTE — ED Triage Notes (Signed)
Pt reports she started her menstrual period yesterday.  Reports has been passing large blood clots.  C/O pain to r side and pelvic area.  Reports also coughed up blood at 3am this morning.  Denies any cold symptoms.

## 2019-09-16 NOTE — ED Provider Notes (Addendum)
[facility] EMERGENCY DEPARTMENT Provider Note   CSN: [id number] Arrival date & time: 09/16/19  0815     History   Chief Complaint Chief Complaint  Patient presents with   Vaginal Bleeding    HPI Veronica Watts is a 23 y.o. female who presents with cc of heavy menstrual bleeding. Patient states that she began her menstrual cycle as usual, but today started to pass quarter size clots which was abnormal for her. She is having some suprapubic pain and moderate cramping.  She states that she was using the toilet today and coughed and had blood in her phlegm. This occurred once. She does not have a cough. She denies cp, sob, UL leg swelling,. She does not smoke, reports no OCPs, recent travel, hx of blood clots personally or in her family. She is sexually acitve with one female partner. She states that he main concerm today was for the bleeding and clotting.     HPI  History reviewed. No pertinent past medical history.  Patient Active Problem List   Diagnosis Date Noted   Encounter for surveillance of contraceptives 08/12/2017   Breakthrough bleeding on Nexplanon 05/15/2017    History reviewed. No pertinent surgical history.   OB History    Gravida  0   Para  0   Term  0   Preterm  0   AB  0   Living  0     SAB  0   TAB  0   Ectopic  0   Multiple  0   Live Births  0            Home Medications    Prior to Admission medications   Medication Sig Start Date End Date Taking? Authorizing Provider  cephALEXin (KEFLEX) 500 MG capsule 2 caps po bid x 7 days 09/16/19   Margarita Mail, PA-C  ibuprofen (ADVIL,MOTRIN) 600 MG tablet Take 1 tablet (600 mg total) by mouth every 6 (six) hours as needed. 06/15/17   Evalee Jefferson, PA-C  metroNIDAZOLE (FLAGYL) 500 MG tablet Take 1 tablet (500 mg total) by mouth 2 (two) times daily. 07/17/18   Fredia Sorrow, MD  naproxen (NAPROSYN) 375 MG tablet Take 1 tablet (375 mg total) by mouth 2 (two) times daily. 09/16/19    Margarita Mail, PA-C    Family History Family History  Problem Relation Age of Onset   Diabetes Paternal Grandmother    Diabetes Maternal Grandmother    Cancer Maternal Grandmother        throat   Diabetes Father     Social History Social History   Tobacco Use   Smoking status: Never Smoker   Smokeless tobacco: Never Used  Substance Use Topics   Alcohol use: No   Drug use: No     Allergies   Patient has no known allergies.   Review of Systems Review of Systems  Ten systems reviewed and are negative for acute change, except as noted in the HPI.   Physical Exam Updated Vital Signs BP (!) 105/55 (BP Location: Right Arm)    Pulse 66    Temp 98.4 F (36.9 C) (Oral)    Resp 20    Ht 5\' 7"  (1.702 m)    Wt (!) 152 kg    LMP 09/15/2019    SpO2 99%    BMI 52.47 kg/m   Physical Exam Vitals signs and nursing note reviewed. Exam conducted with a chaperone present.  Constitutional:      General:  She is not in acute distress.    Appearance: She is well-developed. She is not diaphoretic.  HENT:     Head: Normocephalic and atraumatic.  Eyes:     General: No scleral icterus.    Conjunctiva/sclera: Conjunctivae normal.  Neck:     Musculoskeletal: Normal range of motion.  Cardiovascular:     Rate and Rhythm: Normal rate and regular rhythm.     Heart sounds: Normal heart sounds. No murmur. No friction rub. No gallop.   Pulmonary:     Effort: Pulmonary effort is normal. No respiratory distress.     Breath sounds: Normal breath sounds.  Abdominal:     General: Bowel sounds are normal. There is no distension.     Palpations: Abdomen is soft. There is no mass.     Tenderness: There is no abdominal tenderness. There is no guarding.  Genitourinary:    Comments: Pelvic exam: VULVA: normal appearing vulva with no masses, tenderness or lesions, VAGINA: normal appearing vagina with normal color and discharge, no lesions, no lacerations CERVIX: normal appearing cervix without  discharge or lesions with blood from the cervical os consistent with menstruation, No cmt exam limited by body habitus.   Musculoskeletal:     Comments: Bilateral lower extremities symmetric without edema or calf tenderness.  Skin:    General: Skin is warm and dry.  Neurological:     Mental Status: She is alert and oriented to person, place, and time.  Psychiatric:        Behavior: Behavior normal.      ED Treatments / Results  Labs (all labs ordered are listed, but only abnormal results are displayed) Labs Reviewed  WET PREP, GENITAL - Abnormal; Notable for the following components:      Result Value   Clue Cells Wet Prep HPF POC PRESENT (*)    WBC, Wet Prep HPF POC RARE (*)    All other components within normal limits  URINALYSIS, ROUTINE W REFLEX MICROSCOPIC - Abnormal; Notable for the following components:   Hgb urine dipstick MODERATE (*)    Nitrite POSITIVE (*)    Bacteria, UA RARE (*)    All other components within normal limits  I-STAT CHEM 8, ED - Abnormal; Notable for the following components:   Glucose, Bld 101 (*)    All other components within normal limits  I-STAT BETA HCG BLOOD, ED ([facility], [facility], [facility] ONLY)  GC/CHLAMYDIA PROBE AMP ([place]) [facility]    EKG None  Radiology Dg Chest 2 View  Result Date: 09/16/2019 CLINICAL DATA:  Coughing EXAM: CHEST - 2 VIEW COMPARISON:  06/21/2013 FINDINGS: The heart size and mediastinal contours are within normal limits. Both lungs are clear. The visualized skeletal structures are unremarkable. IMPRESSION: No acute abnormality of the lungs. Electronically Signed   By: Lauralyn PrimesAlex  Bibbey M.D.   On: 09/16/2019 09:10    Procedures Procedures (including critical care time)  Medications Ordered in ED Medications - No data to display   Initial Impression / Assessment and Plan / ED Course  I have reviewed the triage vital signs and the nursing notes.  Pertinent labs & imaging results that were available during my care of the  patient were reviewed by me and considered in my medical decision making (see chart for details).       This is a 23 year old female with morbid obesity, menorrhagia and passage of clots.  She is sexually active with a female partner does not use any oral contraceptive pills.  She reports a single episode of streaky blood in her phlegm.  I have very low suspicion for pulmonary embolus.  I do not think she needs any work-up at this time.  Reports no chest pain, no shortness of breath she has no calf tenderness.  She is hemodynamically stable with normal oxygenation, respiratory rate and pulse. I personally reviewed the patient's lab values which shows a negative pregnancy test.  Urine with positive nitrites and bacteria will treat for UTI.  Patient has clue cells present but denies foul odor or discharge and likely secondary to her vaginal bleeding patient's Chem-8 shows normal hemoglobin level.  Mild elevated glucose.  I personally reviewed the patient's 2 view chest x-ray which shows no abnormalities.  Discussed outpatient follow-up with the patient, given naproxen for pain relief.  Given detailed return precautions.  Appears appropriate for discharge at this time. Patient case discussed with Dr. Verdie Mosher who agrees with work-up and plan for discharge Final Clinical Impressions(s) / ED Diagnoses   Final diagnoses:  Menorrhagia with regular cycle  Urinary tract infection without hematuria, site unspecified    ED Discharge Orders         Ordered    naproxen (NAPROSYN) 375 MG tablet  2 times daily     09/16/19 0953    cephALEXin (KEFLEX) 500 MG capsule     09/16/19 0953           Arthor Captain, PA-C 09/16/19 1017    Arthor Captain, PA-C 09/16/19 1017    Geoffery Lyons, MD 09/16/19 1452

## 2019-09-18 LAB — GC/CHLAMYDIA PROBE AMP (~~LOC~~) NOT AT ARMC
Chlamydia: NEGATIVE
Neisseria Gonorrhea: NEGATIVE

## 2019-10-08 ENCOUNTER — Other Ambulatory Visit: Payer: Self-pay

## 2019-10-08 ENCOUNTER — Ambulatory Visit: Payer: Medicaid Other | Attending: Internal Medicine

## 2019-10-08 DIAGNOSIS — Z20822 Contact with and (suspected) exposure to covid-19: Secondary | ICD-10-CM

## 2019-10-10 LAB — NOVEL CORONAVIRUS, NAA: SARS-CoV-2, NAA: NOT DETECTED

## 2019-11-17 ENCOUNTER — Ambulatory Visit: Payer: Medicaid Other | Attending: Internal Medicine

## 2019-11-17 ENCOUNTER — Other Ambulatory Visit: Payer: Self-pay

## 2019-11-17 DIAGNOSIS — Z20822 Contact with and (suspected) exposure to covid-19: Secondary | ICD-10-CM

## 2019-11-18 LAB — NOVEL CORONAVIRUS, NAA: SARS-CoV-2, NAA: DETECTED — AB

## 2019-11-29 ENCOUNTER — Encounter (HOSPITAL_COMMUNITY): Payer: Self-pay

## 2019-11-29 ENCOUNTER — Emergency Department (HOSPITAL_COMMUNITY)
Admission: EM | Admit: 2019-11-29 | Discharge: 2019-11-29 | Disposition: A | Discharge status: Home / Self Care | Payer: Self-pay | Attending: Emergency Medicine | Admitting: Emergency Medicine

## 2019-11-29 ENCOUNTER — Emergency Department (HOSPITAL_COMMUNITY): Payer: Self-pay

## 2019-11-29 ENCOUNTER — Other Ambulatory Visit: Payer: Self-pay

## 2019-11-29 DIAGNOSIS — Y9301 Activity, walking, marching and hiking: Secondary | ICD-10-CM | POA: Insufficient documentation

## 2019-11-29 DIAGNOSIS — Z791 Long term (current) use of non-steroidal anti-inflammatories (NSAID): Secondary | ICD-10-CM | POA: Insufficient documentation

## 2019-11-29 DIAGNOSIS — Y929 Unspecified place or not applicable: Secondary | ICD-10-CM | POA: Insufficient documentation

## 2019-11-29 DIAGNOSIS — Z79899 Other long term (current) drug therapy: Secondary | ICD-10-CM | POA: Insufficient documentation

## 2019-11-29 DIAGNOSIS — S93402A Sprain of unspecified ligament of left ankle, initial encounter: Secondary | ICD-10-CM | POA: Insufficient documentation

## 2019-11-29 DIAGNOSIS — X500XXA Overexertion from strenuous movement or load, initial encounter: Secondary | ICD-10-CM | POA: Insufficient documentation

## 2019-11-29 DIAGNOSIS — Y999 Unspecified external cause status: Secondary | ICD-10-CM | POA: Insufficient documentation

## 2019-11-29 MED ORDER — IBUPROFEN 600 MG PO TABS: 600.0000 mg | ORAL_TABLET | Freq: Four times a day (QID) | ORAL | 0 refills | Status: DC | PRN

## 2019-11-29 NOTE — ED Notes (Signed)
Went out last night with heels on   Walking and turned ankle while in heels   Did not fall but ankle is tender over medial malleolus   Minimal swelling noted   Sensation intact

## 2019-11-29 NOTE — Discharge Instructions (Signed)
Wear the ankle support as discussed to protect your ankle.   Use ice and elevation as much as possible for the next several days to help reduce the swelling.  Once the swelling is resolved (usually after several days) you may start using heat - applied 15 minutes several times daily to help hasten healing.  Also once the swelling is better, "write" the alphabet with your big toe and foot as discussed several times daily to help regain strength and mobility.  Use the ibuprofen also for inflammation and pain relief.  Call the orthopedic doctor listed for a recheck of your injury in 1 week.  You may benefit from physical therapy of your ankle if it is not improving.

## 2019-11-29 NOTE — ED Triage Notes (Signed)
Pt presents to ED with complaints of left ankle pain started last night after she twisted it walking in high heels.

## 2019-11-30 NOTE — ED Provider Notes (Signed)
[facility] EMERGENCY DEPARTMENT Provider Note   CSN: [id number] Arrival date & time: 11/29/19  1126     History Chief Complaint  Patient presents with  . Ankle Pain    Veronica Watts is a 24 y.o. female presenting with left ankle pain which she woke with today. She was wearing high heeled shoes last night when she slipped and her foot "wiggled" on the shoe but she never fell or inverted the ankle fully.  She woke today with medial and lateral ankle pain.   Pain is aching, constant and worse with palpation, movement and weight bearing.  The patient was able to weight bear immediately after the event.  There is no radiation of pain and the patient denies numbness distal to the injury site.  She has found no alleviators for her symptoms.   HPI     History reviewed. No pertinent past medical history.  Patient Active Problem List   Diagnosis Date Noted  . Encounter for surveillance of contraceptives 08/12/2017  . Breakthrough bleeding on Nexplanon 05/15/2017    History reviewed. No pertinent surgical history.   OB History    Gravida  0   Para  0   Term  0   Preterm  0   AB  0   Living  0     SAB  0   TAB  0   Ectopic  0   Multiple  0   Live Births  0           Family History  Problem Relation Age of Onset  . Diabetes Paternal Grandmother   . Diabetes Maternal Grandmother   . Cancer Maternal Grandmother        throat  . Diabetes Father     Social History   Tobacco Use  . Smoking status: Never Smoker  . Smokeless tobacco: Never Used  Substance Use Topics  . Alcohol use: No  . Drug use: No    Home Medications Prior to Admission medications   Medication Sig Start Date End Date Taking? Authorizing Provider  cephALEXin (KEFLEX) 500 MG capsule 2 caps po bid x 7 days 09/16/19   Margarita Mail, PA-C  ibuprofen (ADVIL) 600 MG tablet Take 1 tablet (600 mg total) by mouth every 6 (six) hours as needed. 11/29/19   Evalee Jefferson, PA-C  metroNIDAZOLE  (FLAGYL) 500 MG tablet Take 1 tablet (500 mg total) by mouth 2 (two) times daily. 07/17/18   Fredia Sorrow, MD  naproxen (NAPROSYN) 375 MG tablet Take 1 tablet (375 mg total) by mouth 2 (two) times daily. 09/16/19   Margarita Mail, PA-C    Allergies    Patient has no known allergies.  Review of Systems   Review of Systems  Musculoskeletal: Positive for arthralgias and joint swelling.  Skin: Negative for wound.  Neurological: Negative for weakness and numbness.    Physical Exam Updated Vital Signs BP 101/71 (BP Location: Right Arm)   Pulse 76   Temp 98.9 F (37.2 C) (Oral)   Ht 5\' 7"  (1.702 m)   Wt (!) 161.5 kg   LMP 11/18/2019   SpO2 96%   BMI 55.76 kg/m   Physical Exam Vitals and nursing note reviewed.  Constitutional:      Appearance: She is well-developed.  HENT:     Head: Normocephalic.  Cardiovascular:     Rate and Rhythm: Normal rate.     Pulses: No decreased pulses.          Dorsalis  pedis pulses are 2+ on the right side and 2+ on the left side.       Posterior tibial pulses are 2+ on the right side and 2+ on the left side.  Musculoskeletal:        General: Tenderness present.     Right ankle: Swelling present. No ecchymosis. Tenderness present over the lateral malleolus and medial malleolus. No base of 5th metatarsal or proximal fibula tenderness. Decreased range of motion. Normal pulse.     Right Achilles Tendon: Normal. No tenderness or defects.  Skin:    General: Skin is warm and dry.  Neurological:     Mental Status: She is alert.     Sensory: No sensory deficit.     ED Results / Procedures / Treatments   Labs (all labs ordered are listed, but only abnormal results are displayed) Labs Reviewed - No data to display  EKG None  Radiology DG Ankle Complete Left  Result Date: 11/29/2019 CLINICAL DATA:  Twisting injury, pain EXAM: LEFT ANKLE COMPLETE - 3+ VIEW COMPARISON:  06/15/2017 FINDINGS: There is no evidence of fracture, dislocation, or joint  effusion. There is no evidence of arthropathy or other focal bone abnormality. Soft tissues are unremarkable. IMPRESSION: No fracture or dislocation of the left ankle. Joint spaces are well preserved. Electronically Signed   By: Lauralyn Primes M.D.   On: 11/29/2019 11:54    Procedures Procedures (including critical care time)  Medications Ordered in ED Medications - No data to display  ED Course  I have reviewed the triage vital signs and the nursing notes.  Pertinent labs & imaging results that were available during my care of the patient were reviewed by me and considered in my medical decision making (see chart for details).    MDM Rules/Calculators/A&P                      RICE, air cast which was provided.  Discussed role of ice and heat therapy. Prn f/u if not improving over the next 7-10 days if not improving.   discussed injury may take weeks to heal completely, should wear air cast with any weight bearing/ambulation until pain free.  Discussed exercises once swelling resolved. Imaging reviewed and interpreted.   Final Clinical Impression(s) / ED Diagnoses Final diagnoses:  Sprain of left ankle, unspecified ligament, initial encounter    Rx / DC Orders ED Discharge Orders         Ordered    ibuprofen (ADVIL) 600 MG tablet  Every 6 hours PRN     11/29/19 1221           Burgess Amor, PA-C 11/30/19 0809    Vanetta Mulders, MD 11/30/19 1556

## 2020-01-07 ENCOUNTER — Encounter (HOSPITAL_COMMUNITY): Payer: Self-pay | Admitting: Emergency Medicine

## 2020-01-07 ENCOUNTER — Emergency Department (HOSPITAL_COMMUNITY)
Admission: EM | Admit: 2020-01-07 | Discharge: 2020-01-07 | Disposition: A | Discharge status: Home / Self Care | Payer: Medicaid Other | Attending: Emergency Medicine | Admitting: Emergency Medicine

## 2020-01-07 ENCOUNTER — Other Ambulatory Visit: Payer: Self-pay

## 2020-01-07 DIAGNOSIS — Z23 Encounter for immunization: Secondary | ICD-10-CM | POA: Insufficient documentation

## 2020-01-07 DIAGNOSIS — W500XXA Accidental hit or strike by another person, initial encounter: Secondary | ICD-10-CM | POA: Insufficient documentation

## 2020-01-07 DIAGNOSIS — S01512A Laceration without foreign body of oral cavity, initial encounter: Secondary | ICD-10-CM | POA: Insufficient documentation

## 2020-01-07 DIAGNOSIS — Y9389 Activity, other specified: Secondary | ICD-10-CM | POA: Insufficient documentation

## 2020-01-07 DIAGNOSIS — Y929 Unspecified place or not applicable: Secondary | ICD-10-CM | POA: Insufficient documentation

## 2020-01-07 DIAGNOSIS — Z79899 Other long term (current) drug therapy: Secondary | ICD-10-CM | POA: Insufficient documentation

## 2020-01-07 DIAGNOSIS — Y999 Unspecified external cause status: Secondary | ICD-10-CM | POA: Insufficient documentation

## 2020-01-07 MED ORDER — TETANUS-DIPHTH-ACELL PERTUSSIS 5-2.5-18.5 LF-MCG/0.5 IM SUSP
0.5000 mL | Freq: Once | INTRAMUSCULAR | Status: AC
  Administered 2020-01-07: 13:00:00 0.5 mL via INTRAMUSCULAR
  Filled 2020-01-07: qty 0.5
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
## 2020-01-07 NOTE — Discharge Instructions (Addendum)
You have been diagnosed today with Tongue Laceration.  At this time there does not appear to be the presence of an emergent medical condition, however there is always the potential for conditions to change. Please read and follow the below instructions.  Please return to the Emergency Department immediately for any new or worsening symptoms. Please be sure to follow up with your Primary Care Provider within one week regarding your visit today; please call their office to schedule an appointment even if you are feeling better for a follow-up visit. Please use warm salt water rinses several times a day to avoid infection of your cut and to remove any food products from the area.  Please drink plenty of water and get plenty of rest.  Please follow-up with your primary care provider within the next week for a recheck.  Get help right away if: The edges of your wound break open. Your face or the area under your jaw gets swollen. You have trouble breathing or swallowing. You have any new/concerning or worsening of symptoms  Please read the additional information packets attached to your discharge summary.  Do not take your medicine if  develop an itchy rash, swelling in your mouth or lips, or difficulty breathing; call 911 and seek immediate emergency medical attention if this occurs.  Note: Portions of this text may have been transcribed using voice recognition software. Every effort was made to ensure accuracy; however, inadvertent computerized transcription errors may still be present.

## 2020-04-17 ENCOUNTER — Encounter (HOSPITAL_COMMUNITY): Payer: Self-pay | Admitting: Emergency Medicine

## 2020-04-17 ENCOUNTER — Other Ambulatory Visit: Payer: Self-pay

## 2020-04-17 DIAGNOSIS — M791 Myalgia, unspecified site: Secondary | ICD-10-CM | POA: Insufficient documentation

## 2020-04-17 DIAGNOSIS — M2550 Pain in unspecified joint: Secondary | ICD-10-CM | POA: Insufficient documentation

## 2020-04-17 DIAGNOSIS — L03012 Cellulitis of left finger: Secondary | ICD-10-CM | POA: Insufficient documentation

## 2020-04-17 NOTE — ED Triage Notes (Signed)
Pt here due to swelling to LEFT middle finger.

## 2020-04-18 ENCOUNTER — Emergency Department (HOSPITAL_COMMUNITY)
Admission: EM | Admit: 2020-04-18 | Discharge: 2020-04-18 | Disposition: A | Discharge status: Home / Self Care | Payer: Self-pay | Attending: Emergency Medicine | Admitting: Emergency Medicine

## 2020-04-18 ENCOUNTER — Emergency Department (HOSPITAL_COMMUNITY): Payer: Self-pay

## 2020-04-18 DIAGNOSIS — L03012 Cellulitis of left finger: Secondary | ICD-10-CM

## 2020-04-18 MED ORDER — LIDOCAINE HCL (PF) 1 % IJ SOLN
30.0000 mL | Freq: Once | INTRAMUSCULAR | Status: AC
  Administered 2020-04-18: 30 mL
  Filled 2020-04-18: qty 30

## 2020-04-18 MED ORDER — DOXYCYCLINE HYCLATE 100 MG PO CAPS: 100.0000 mg | ORAL_CAPSULE | Freq: Two times a day (BID) | ORAL | 0 refills | Status: DC

## 2020-04-18 MED ORDER — AMOXICILLIN-POT CLAVULANATE 875-125 MG PO TABS: 1.0000 | ORAL_TABLET | Freq: Two times a day (BID) | ORAL | 0 refills | Status: DC

## 2020-04-18 MED ORDER — POVIDONE-IODINE 5 % EX SOLN
Freq: Once | CUTANEOUS | Status: DC
  Filled 2020-04-18: qty 88.7

## 2020-04-18 NOTE — ED Provider Notes (Signed)
[facility] EMERGENCY DEPARTMENT Provider Note   CSN: [id number] Arrival date & time: 04/17/20  2227     History Chief Complaint  Patient presents with  . finger swelling    Veronica Watts is a 24 y.o. female.  Patient with pain and swelling to her left middle finger.  States she had a "hangnail" 1 week ago.  Since then she developed pain, swelling and drainage of the distal fingertip near the nailbed.  She states it "turned green".  There is no fever, chills, nausea or vomiting.  No focal weakness, numbness or tingling.  No chest pain or shortness of breath.  Tetanus shot is up-to-date.  The history is provided by the patient.       History reviewed. No pertinent past medical history.  Patient Active Problem List   Diagnosis Date Noted  . Encounter for surveillance of contraceptives 08/12/2017  . Breakthrough bleeding on Nexplanon 05/15/2017    History reviewed. No pertinent surgical history.   OB History    Gravida  0   Para  0   Term  0   Preterm  0   AB  0   Living  0     SAB  0   TAB  0   Ectopic  0   Multiple  0   Live Births  0           Family History  Problem Relation Age of Onset  . Diabetes Paternal Grandmother   . Diabetes Maternal Grandmother   . Cancer Maternal Grandmother        throat  . Diabetes Father     Social History   Tobacco Use  . Smoking status: Never Smoker  . Smokeless tobacco: Never Used  Substance Use Topics  . Alcohol use: No  . Drug use: No    Home Medications Prior to Admission medications   Medication Sig Start Date End Date Taking? Authorizing Provider  cephALEXin (KEFLEX) 500 MG capsule 2 caps po bid x 7 days 09/16/19   Arthor Captain, PA-C  ibuprofen (ADVIL) 600 MG tablet Take 1 tablet (600 mg total) by mouth every 6 (six) hours as needed. 11/29/19   Burgess Amor, PA-C  metroNIDAZOLE (FLAGYL) 500 MG tablet Take 1 tablet (500 mg total) by mouth 2 (two) times daily. 07/17/18   Vanetta Mulders, MD    naproxen (NAPROSYN) 375 MG tablet Take 1 tablet (375 mg total) by mouth 2 (two) times daily. 09/16/19   Arthor Captain, PA-C    Allergies    Patient has no known allergies.  Review of Systems   Review of Systems  Constitutional: Negative for activity change, appetite change and fever.  HENT: Negative for congestion and rhinorrhea.   Eyes: Negative for visual disturbance.  Respiratory: Negative for chest tightness.   Cardiovascular: Negative for chest pain.  Gastrointestinal: Negative for abdominal pain, nausea and vomiting.  Genitourinary: Negative for dysuria and hematuria.  Musculoskeletal: Positive for arthralgias and myalgias.  Skin: Positive for wound.  Neurological: Negative for dizziness, weakness and headaches.   all other systems are negative except as noted in the HPI and PMH.   Physical Exam Updated Vital Signs BP 127/83 (BP Location: Right Arm)   Pulse 81   Temp 98.4 F (36.9 C) (Oral)   Resp 20   Ht 5\' 7"  (1.702 m)   Wt (!) 158.8 kg   SpO2 99%   BMI 54.82 kg/m   Physical Exam Vitals and nursing note reviewed.  Constitutional:      General: She is not in acute distress.    Appearance: She is well-developed. She is obese.  HENT:     Head: Normocephalic and atraumatic.     Mouth/Throat:     Pharynx: No oropharyngeal exudate.  Eyes:     Conjunctiva/sclera: Conjunctivae normal.     Pupils: Pupils are equal, round, and reactive to light.  Neck:     Comments: No meningismus. Cardiovascular:     Rate and Rhythm: Normal rate and regular rhythm.     Heart sounds: Normal heart sounds. No murmur heard.   Pulmonary:     Effort: Pulmonary effort is normal. No respiratory distress.     Breath sounds: Normal breath sounds.  Abdominal:     Palpations: Abdomen is soft.     Tenderness: There is no abdominal tenderness. There is no guarding or rebound.  Musculoskeletal:        General: No tenderness. Normal range of motion.     Cervical back: Normal range of  motion and neck supple.     Comments: Isolated exam of left middle finger shows edema and purulence along the medial nail fold.  There is evidence of ingrown fingernail. Full range of motion of PIP, DIP, MCP joints.  Distal sensation intact.  Compartments soft.  No tenderness along flexor tendon sheath  Skin:    General: Skin is warm.  Neurological:     Mental Status: She is alert and oriented to person, place, and time.     Cranial Nerves: No cranial nerve deficit.     Motor: No abnormal muscle tone.     Coordination: Coordination normal.     Comments:  5/5 strength throughout. CN 2-12 intact.Equal grip strength.   Psychiatric:        Behavior: Behavior normal.       ED Results / Procedures / Treatments   Labs (all labs ordered are listed, but only abnormal results are displayed) Labs Reviewed - No data to display  EKG None  Radiology No results found.  Procedures .Marland KitchenIncision and Drainage  Date/Time: 04/18/2020 1:51 AM Performed by: Glynn Octave, MD Authorized by: Glynn Octave, MD   Consent:    Consent obtained:  Verbal   Consent given by:  Patient   Risks discussed:  Bleeding, incomplete drainage, pain, infection and damage to other organs   Alternatives discussed:  No treatment Location:    Type:  Abscess (paronychia)   Location:  Upper extremity   Upper extremity location:  Finger   Finger location:  L long finger Pre-procedure details:    Skin preparation:  Betadine Anesthesia (see MAR for exact dosages):    Anesthesia method:  Nerve block   Block location:  Digital   Block needle gauge:  25 G   Block anesthetic:  Lidocaine 1% w/o epi   Block technique:  Digital   Block injection procedure:  Anatomic landmarks identified, anatomic landmarks palpated, negative aspiration for blood, introduced needle and incremental injection   Block outcome:  Anesthesia achieved Procedure type:    Complexity:  Simple Procedure details:    Needle aspiration: no      Incision types:  Single straight   Incision depth:  Subcutaneous   Scalpel blade:  11   Wound management:  Probed and deloculated and irrigated with saline   Drainage:  Purulent   Drainage amount:  Moderate   Wound treatment:  Wound left open   Packing materials:  None Post-procedure details:  Patient tolerance of procedure:  Tolerated well, no immediate complications   (including critical care time)  Medications Ordered in ED Medications  lidocaine (PF) (XYLOCAINE) 1 % injection 30 mL (has no administration in time range)  povidone-Iodine (BETADINE) 5 % topical solution (has no administration in time range)    ED Course  I have reviewed the triage vital signs and the nursing notes.  Pertinent labs & imaging results that were available during my care of the patient were reviewed by me and considered in my medical decision making (see chart for details).    MDM Rules/Calculators/A&P                         Paronychia, neurovascularly intact.  X-ray obtained in triage is negative.  Wound cleaned, digital block performed, incision and drainage as above with expression of purulence.  Discussed warm soaks, antibiotics, hand surgery follow-up.  Return precautions discussed. Final Clinical Impression(s) / ED Diagnoses Final diagnoses:  Paronychia of finger of left hand    Rx / DC Orders ED Discharge Orders         Ordered    doxycycline (VIBRAMYCIN) 100 MG capsule  2 times daily     Discontinue  Reprint     04/18/20 0243    amoxicillin-clavulanate (AUGMENTIN) 875-125 MG tablet  Every 12 hours     Discontinue  Reprint     04/18/20 0243           Glynn Octave, MD 04/18/20 0451

## 2020-04-18 NOTE — Discharge Instructions (Signed)
Take the antibiotics as prescribed and perform the warm soaks 2-3 times daily.  Follow-up with a hand doctor for recheck this week.  Return to the ED if you develop new or worsening symptoms including fever, weakness, numbness, tingling, or other concerns.

## 2020-04-18 NOTE — ED Notes (Signed)
Suture Cart at bedside. RN soaking Pts hand in warm water with providone

## 2020-10-11 ENCOUNTER — Other Ambulatory Visit: Payer: Self-pay

## 2020-10-11 ENCOUNTER — Emergency Department (HOSPITAL_COMMUNITY)
Admission: EM | Admit: 2020-10-11 | Discharge: 2020-10-11 | Disposition: A | Discharge status: Home / Self Care | Payer: Medicaid Other | Attending: Emergency Medicine | Admitting: Emergency Medicine

## 2020-10-11 ENCOUNTER — Encounter (HOSPITAL_COMMUNITY): Payer: Self-pay | Admitting: Emergency Medicine

## 2020-10-11 DIAGNOSIS — N921 Excessive and frequent menstruation with irregular cycle: Secondary | ICD-10-CM | POA: Insufficient documentation

## 2020-10-11 DIAGNOSIS — F17200 Nicotine dependence, unspecified, uncomplicated: Secondary | ICD-10-CM | POA: Insufficient documentation

## 2020-10-11 LAB — CBC WITH DIFFERENTIAL/PLATELET
Abs Immature Granulocytes: 0.03 10*3/uL (ref 0.00–0.07)
Basophils Absolute: 0 10*3/uL (ref 0.0–0.1)
Basophils Relative: 0 %
Eosinophils Absolute: 0.1 10*3/uL (ref 0.0–0.5)
Eosinophils Relative: 1 %
HCT: 34.6 % — ABNORMAL LOW (ref 36.0–46.0)
Hemoglobin: 10.9 g/dL — ABNORMAL LOW (ref 12.0–15.0)
Immature Granulocytes: 0 %
Lymphocytes Relative: 28 %
Lymphs Abs: 2.4 10*3/uL (ref 0.7–4.0)
MCH: 29.1 pg (ref 26.0–34.0)
MCHC: 31.5 g/dL (ref 30.0–36.0)
MCV: 92.5 fL (ref 80.0–100.0)
Monocytes Absolute: 0.8 10*3/uL (ref 0.1–1.0)
Monocytes Relative: 10 %
Neutro Abs: 5.3 10*3/uL (ref 1.7–7.7)
Neutrophils Relative %: 61 %
Platelets: 357 10*3/uL (ref 150–400)
RBC: 3.74 MIL/uL — ABNORMAL LOW (ref 3.87–5.11)
RDW: 13.3 % (ref 11.5–15.5)
WBC: 8.6 10*3/uL (ref 4.0–10.5)
nRBC: 0 % (ref 0.0–0.2)

## 2020-10-11 LAB — BASIC METABOLIC PANEL
Anion gap: 7 (ref 5–15)
BUN: 14 mg/dL (ref 6–20)
CO2: 25 mmol/L (ref 22–32)
Calcium: 8.9 mg/dL (ref 8.9–10.3)
Chloride: 106 mmol/L (ref 98–111)
Creatinine, Ser: 0.83 mg/dL (ref 0.44–1.00)
GFR, Estimated: >60 mL/min (ref >60)
Glucose, Bld: 91 mg/dL (ref 70–99)
Potassium: 3.4 mmol/L — ABNORMAL LOW (ref 3.5–5.1)
Sodium: 138 mmol/L (ref 135–145)

## 2020-10-11 LAB — HCG, SERUM, QUALITATIVE: Preg, Serum: NEGATIVE

## 2020-10-11 LAB — WET PREP, GENITAL
Clue Cells Wet Prep HPF POC: NONE SEEN
Sperm: NONE SEEN
Trich, Wet Prep: NONE SEEN
WBC, Wet Prep HPF POC: NONE SEEN
Yeast Wet Prep HPF POC: NONE SEEN

## 2020-10-11 MED ORDER — FERROUS SULFATE 325 (65 FE) MG PO TABS: 325.0000 mg | ORAL_TABLET | Freq: Every day | ORAL | 0 refills | Status: DC

## 2020-10-11 MED ORDER — MEDROXYPROGESTERONE ACETATE 5 MG PO TABS: 5.0000 mg | ORAL_TABLET | Freq: Every day | ORAL | 0 refills | Status: DC

## 2020-10-11 MED ORDER — KETOROLAC TROMETHAMINE 30 MG/ML IJ SOLN
30.0000 mg | Freq: Once | INTRAMUSCULAR | Status: AC
  Administered 2020-10-11: 22:00:00 30 mg via INTRAVENOUS
  Filled 2020-10-11: qty 1

## 2020-10-11 MED ORDER — SODIUM CHLORIDE 0.9 % IV BOLUS
1000.0000 mL | Freq: Once | INTRAVENOUS | Status: AC
  Administered 2020-10-11: 22:00:00 1000 mL via INTRAVENOUS

## 2020-10-11 MED ORDER — IBUPROFEN 600 MG PO TABS: 600.0000 mg | ORAL_TABLET | Freq: Four times a day (QID) | ORAL | 0 refills | Status: DC | PRN

## 2020-10-11 NOTE — ED Triage Notes (Signed)
Pt c/o heavy vaginal bleeding with clot x 2-3 days with back pain.

## 2020-10-11 NOTE — ED Notes (Signed)
Denies dizziness or syncope, reports generalized weakness and low back pain.

## 2020-10-11 NOTE — ED Notes (Signed)
Blood collected and sent to lab after IV start. °

## 2020-10-11 NOTE — ED Notes (Signed)
Pt given pads and disposable underwear

## 2020-10-11 NOTE — ED Provider Notes (Signed)
[facility] EMERGENCY DEPARTMENT Provider Note   CSN: [id number] Arrival date & time: 10/11/20  2009     History Chief Complaint  Patient presents with  . Vaginal Bleeding    Veronica Watts is a 24 y.o. female.  Pt presents to the ED today with vaginal bleeding.  Pt said she is passing large blood clots.  Her LMP was on 11/23 and she is concerned because this is early.  She is having a lot of cramping as well.  She has a hx of heavy periods, but feels like this is much heavier than usual.          History reviewed. No pertinent past medical history.  Patient Active Problem List   Diagnosis Date Noted  . Encounter for surveillance of contraceptives 08/12/2017  . Breakthrough bleeding on Nexplanon 05/15/2017    History reviewed. No pertinent surgical history.   OB History    Gravida  0   Para  0   Term  0   Preterm  0   AB  0   Living  0     SAB  0   IAB  0   Ectopic  0   Multiple  0   Live Births  0           Family History  Problem Relation Age of Onset  . Diabetes Paternal Grandmother   . Diabetes Maternal Grandmother   . Cancer Maternal Grandmother        throat  . Diabetes Father     Social History   Tobacco Use  . Smoking status: Current Every Day Smoker  . Smokeless tobacco: Never Used  Substance Use Topics  . Alcohol use: No  . Drug use: No    Home Medications Prior to Admission medications   Medication Sig Start Date End Date Taking? Authorizing Provider  amoxicillin-clavulanate (AUGMENTIN) 875-125 MG tablet Take 1 tablet by mouth every 12 (twelve) hours. 04/18/20   Rancour, Jeannett Senior, MD  cephALEXin (KEFLEX) 500 MG capsule 2 caps po bid x 7 days 09/16/19   Arthor Captain, PA-C  doxycycline (VIBRAMYCIN) 100 MG capsule Take 1 capsule (100 mg total) by mouth 2 (two) times daily. 04/18/20   Rancour, Jeannett Senior, MD  ferrous sulfate 325 (65 FE) MG tablet Take 1 tablet (325 mg total) by mouth daily. 10/11/20   Jacalyn Lefevre, MD   ibuprofen (ADVIL) 600 MG tablet Take 1 tablet (600 mg total) by mouth every 6 (six) hours as needed. 11/29/19   Burgess Amor, PA-C  ibuprofen (ADVIL) 600 MG tablet Take 1 tablet (600 mg total) by mouth every 6 (six) hours as needed. 10/11/20   Jacalyn Lefevre, MD  medroxyPROGESTERone (PROVERA) 5 MG tablet Take 1 tablet (5 mg total) by mouth daily. 10/11/20   Jacalyn Lefevre, MD  metroNIDAZOLE (FLAGYL) 500 MG tablet Take 1 tablet (500 mg total) by mouth 2 (two) times daily. 07/17/18   Vanetta Mulders, MD  naproxen (NAPROSYN) 375 MG tablet Take 1 tablet (375 mg total) by mouth 2 (two) times daily. 09/16/19   Arthor Captain, PA-C    Allergies    Patient has no known allergies.  Review of Systems   Review of Systems  Gastrointestinal: Positive for abdominal pain.  Genitourinary: Positive for vaginal bleeding.  All other systems reviewed and are negative.   Physical Exam Updated Vital Signs BP (!) 143/85   Pulse 92   Temp 98.5 F (36.9 C) (Oral)   Resp 18   Ht  5\' 7"  (1.702 m)   Wt (!) 142.9 kg   LMP 10/09/2020   SpO2 100%   BMI 49.34 kg/m   Physical Exam Vitals and nursing note reviewed. Exam conducted with a chaperone present.  Constitutional:      Appearance: Normal appearance.  HENT:     Head: Normocephalic and atraumatic.     Right Ear: External ear normal.     Left Ear: External ear normal.     Nose: Nose normal.     Mouth/Throat:     Mouth: Mucous membranes are moist.     Pharynx: Oropharynx is clear.  Eyes:     Extraocular Movements: Extraocular movements intact.     Conjunctiva/sclera: Conjunctivae normal.     Pupils: Pupils are equal, round, and reactive to light.  Cardiovascular:     Rate and Rhythm: Normal rate and regular rhythm.     Pulses: Normal pulses.     Heart sounds: Normal heart sounds.  Pulmonary:     Effort: Pulmonary effort is normal.     Breath sounds: Normal breath sounds.  Abdominal:     General: Abdomen is flat. Bowel sounds are normal.      Palpations: Abdomen is soft.     Tenderness: There is abdominal tenderness in the suprapubic area.  Genitourinary:    Exam position: Lithotomy position.     Vagina: Bleeding present.     Cervix: Cervical bleeding present.     Uterus: Tender.      Adnexa:        Right: Tenderness present.        Left: Tenderness present.   Musculoskeletal:        General: Normal range of motion.     Cervical back: Normal range of motion and neck supple.  Skin:    General: Skin is warm.     Capillary Refill: Capillary refill takes less than 2 seconds.  Neurological:     General: No focal deficit present.     Mental Status: She is alert and oriented to person, place, and time.  Psychiatric:        Mood and Affect: Mood normal.        Behavior: Behavior normal.     ED Results / Procedures / Treatments   Labs (all labs ordered are listed, but only abnormal results are displayed) Labs Reviewed  BASIC METABOLIC PANEL - Abnormal; Notable for the following components:      Result Value   Potassium 3.4 (*)    All other components within normal limits  CBC WITH DIFFERENTIAL/PLATELET - Abnormal; Notable for the following components:   RBC 3.74 (*)    Hemoglobin 10.9 (*)    HCT 34.6 (*)    All other components within normal limits  WET PREP, GENITAL  HCG, SERUM, QUALITATIVE  GC/CHLAMYDIA PROBE AMP ([facility]) NOT AT [facility]    EKG None  Radiology No results found.  Procedures Procedures (including critical care time)  Medications Ordered in ED Medications  sodium chloride 0.9 % bolus 1,000 mL (1,000 mLs Intravenous New Bag/Given 10/11/20 2222)  ketorolac (TORADOL) 30 MG/ML injection 30 mg (30 mg Intravenous Given 10/11/20 2219)    ED Course  I have reviewed the triage vital signs and the nursing notes.  Pertinent labs & imaging results that were available during my care of the patient were reviewed by me and considered in my medical decision making (see chart for details).    MDM  Rules/Calculators/A&P  Preg is negative. Pt is passing large blood clots.  Hgb has dropped to 10.9 from 12.9 a year ago.  Pt d/c on provera, iron, and ibuprofen.  She is instructed to f/u with gyn.  Return if worse. Final Clinical Impression(s) / ED Diagnoses Final diagnoses:  Menorrhagia with irregular cycle    Rx / DC Orders ED Discharge Orders         Ordered    medroxyPROGESTERone (PROVERA) 5 MG tablet  Daily        10/11/20 2312    ibuprofen (ADVIL) 600 MG tablet  Every 6 hours PRN        10/11/20 2312    ferrous sulfate 325 (65 FE) MG tablet  Daily        10/11/20 2312           Jacalyn Lefevre, MD 10/11/20 2313

## 2020-10-11 NOTE — ED Notes (Signed)
Pt reports possibility of pregnancy, has not taken a pregnancy test.

## 2020-10-13 LAB — GC/CHLAMYDIA PROBE AMP (~~LOC~~) NOT AT ARMC
Chlamydia: NEGATIVE
Comment: NEGATIVE
Comment: NORMAL
Neisseria Gonorrhea: NEGATIVE

## 2020-10-27 ENCOUNTER — Encounter: Payer: Self-pay | Admitting: Advanced Practice Midwife

## 2020-10-27 ENCOUNTER — Ambulatory Visit (INDEPENDENT_AMBULATORY_CARE_PROVIDER_SITE_OTHER): Payer: Self-pay | Admitting: Advanced Practice Midwife

## 2020-10-27 ENCOUNTER — Other Ambulatory Visit: Payer: Self-pay

## 2020-10-27 VITALS — BP 115/73 | HR 93 | Ht 67.0 in | Wt 368.2 lb

## 2020-10-27 DIAGNOSIS — N920 Excessive and frequent menstruation with regular cycle: Secondary | ICD-10-CM

## 2020-10-27 MED ORDER — TRANEXAMIC ACID 650 MG PO TABS: 1300.0000 mg | ORAL_TABLET | Freq: Three times a day (TID) | ORAL | 6 refills | Status: DC

## 2020-10-27 MED ORDER — PNV PRENATAL PLUS MULTIVITAMIN 27-1 MG PO TABS: 1.0000 | ORAL_TABLET | Freq: Every day | ORAL | 11 refills | Status: DC

## 2020-10-27 NOTE — Progress Notes (Signed)
[facility] Clinic Visit  Patient name: Veronica Watts MRN [id number]  Date of birth: 31-Mar-1996  CC & HPI:  Veronica Watts is a 25 y.o.  female presenting today for heavy periods.  Went to ED 12/21, given 5 days of provera, iron and ibuprofen.  Hgb 10.9.  Didn't pick up iron, bleeding stopped.  Wants to get pregnant.   Pertinent History Reviewed:  Medical & Surgical Hx:   History reviewed. No pertinent past medical history. History reviewed. No pertinent surgical history. Family History  Problem Relation Age of Onset  . Diabetes Paternal Grandmother   . Diabetes Maternal Grandmother   . Cancer Maternal Grandmother        throat  . Diabetes Father     Current Outpatient Medications:  .  ibuprofen (ADVIL) 600 MG tablet, Take 1 tablet (600 mg total) by mouth every 6 (six) hours as needed., Disp: 30 tablet, Rfl: 0 .  Prenatal Vit-Fe Fumarate-FA (PNV PRENATAL PLUS MULTIVITAMIN) 27-1 MG TABS, Take 1 tablet by mouth daily., Disp: 30 tablet, Rfl: 11 .  tranexamic acid (LYSTEDA) 650 MG TABS tablet, Take 2 tablets (1,300 mg total) by mouth 3 (three) times daily., Disp: 30 tablet, Rfl: 6 .  ferrous sulfate 325 (65 FE) MG tablet, Take 1 tablet (325 mg total) by mouth daily. (Patient not taking: Reported on 10/27/2020), Disp: 30 tablet, Rfl: 0 .  ibuprofen (ADVIL) 600 MG tablet, Take 1 tablet (600 mg total) by mouth every 6 (six) hours as needed. (Patient not taking: Reported on 10/27/2020), Disp: 30 tablet, Rfl: 0 .  medroxyPROGESTERone (PROVERA) 5 MG tablet, Take 1 tablet (5 mg total) by mouth daily. (Patient not taking: Reported on 10/27/2020), Disp: 5 tablet, Rfl: 0 Social History: Reviewed -  reports that she has been smoking. She has never used smokeless tobacco.  Review of Systems:   Constitutional: Negative for fever and chills Eyes: Negative for visual disturbances Respiratory: Negative for shortness of breath, dyspnea Cardiovascular: Negative for chest pain or palpitations   Gastrointestinal: Negative for vomiting, diarrhea and constipation; no abdominal pain Genitourinary: Negative for dysuria and urgency, vaginal irritation or itching Musculoskeletal: Negative for back pain, joint pain, myalgias  Neurological: Negative for dizziness and headaches    Objective Findings:    Physical Examination: Vitals:   10/27/20 1104  BP: 115/73  Pulse: 93   General appearance - well appearing, and in no distress Mental status - alert, oriented to person, place, and time Chest:  Normal respiratory effort Heart - normal rate and regular rhythm Abdomen:  Soft, nontender Pelvic: deferred Musculoskeletal:  Normal range of motion without pain Extremities:  No edema    No results found for this or any previous visit (from the past 24 hour(s)).    Assessment & Plan:  A:   Menorrhagia, desired pg P:  Lysteda and PNV  Aware that other tx involve BC   Return for If you have any problems.  Jacklyn Shell CNM 10/27/2020 11:51 AM

## 2020-10-31 IMAGING — DX DG ANKLE COMPLETE 3+V*L*
3 series · 3 of 3 positions shown · non-contrast
Comparison: 06/15/2017

CLINICAL DATA: Twisting injury, pain

EXAM:
LEFT ANKLE COMPLETE - 3+ VIEW

[ankle ap]
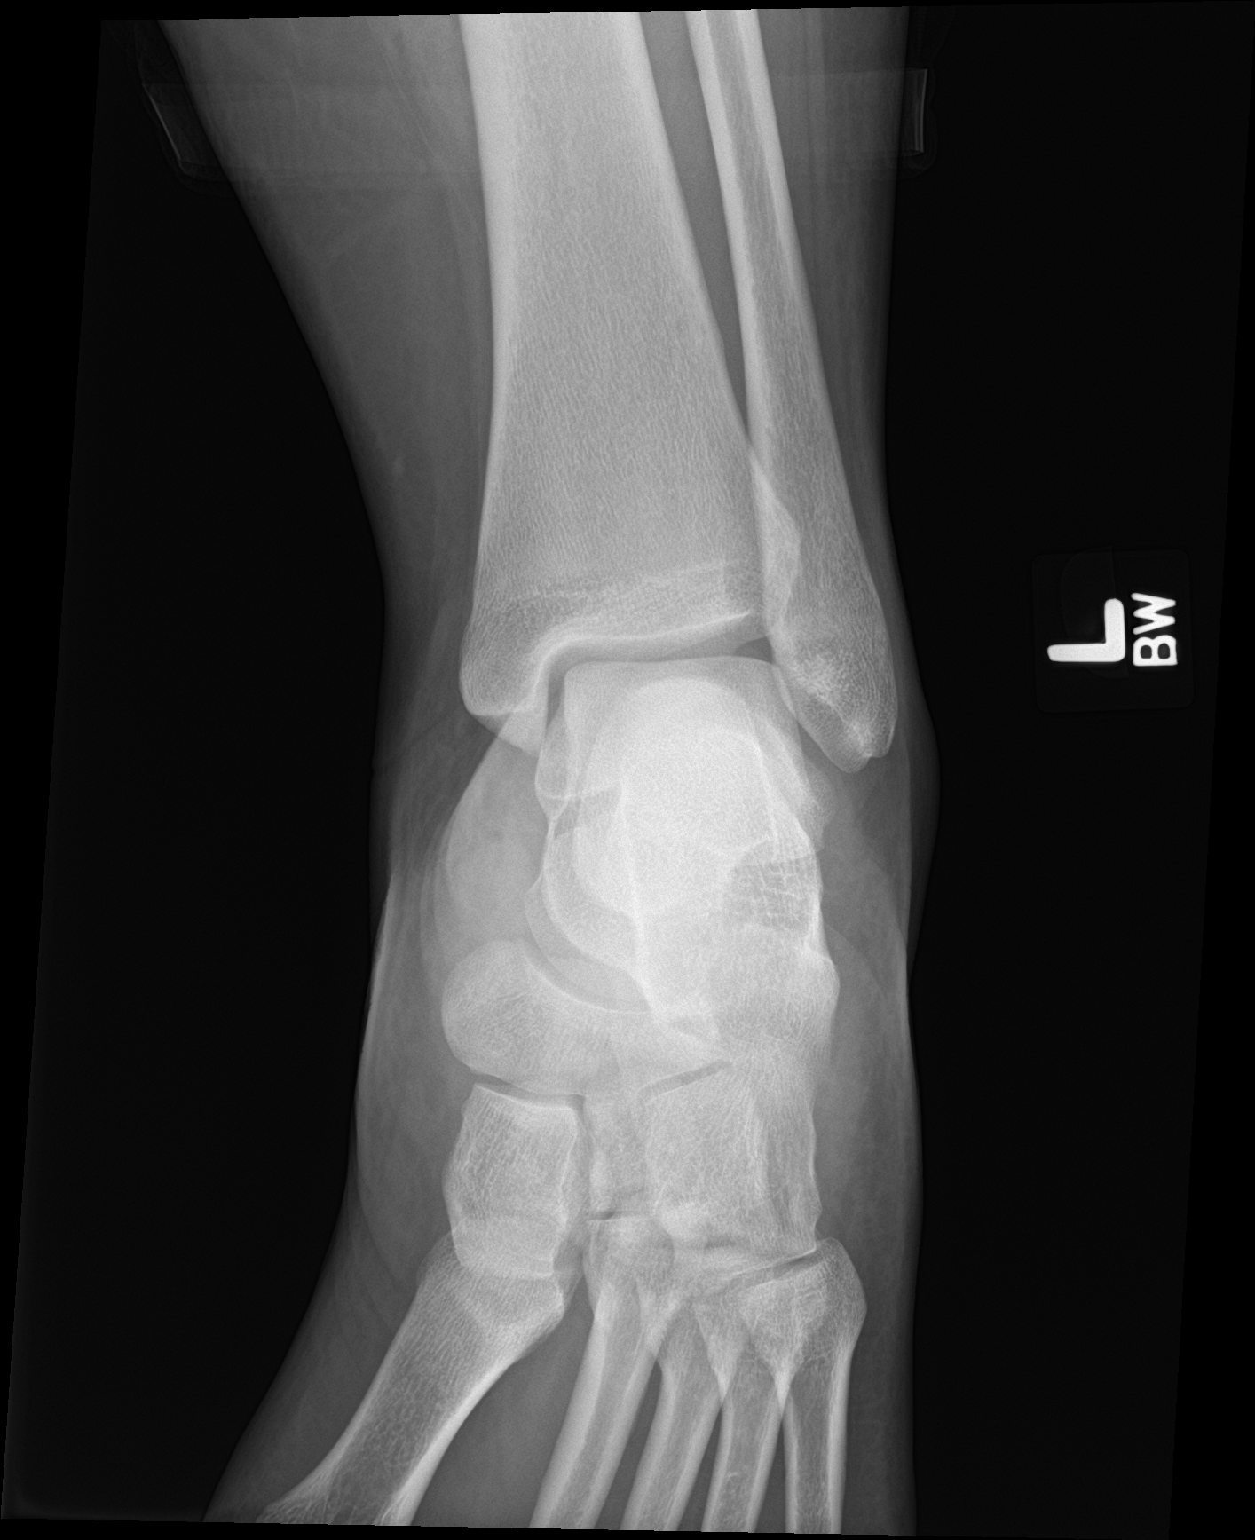

[ankle obl]
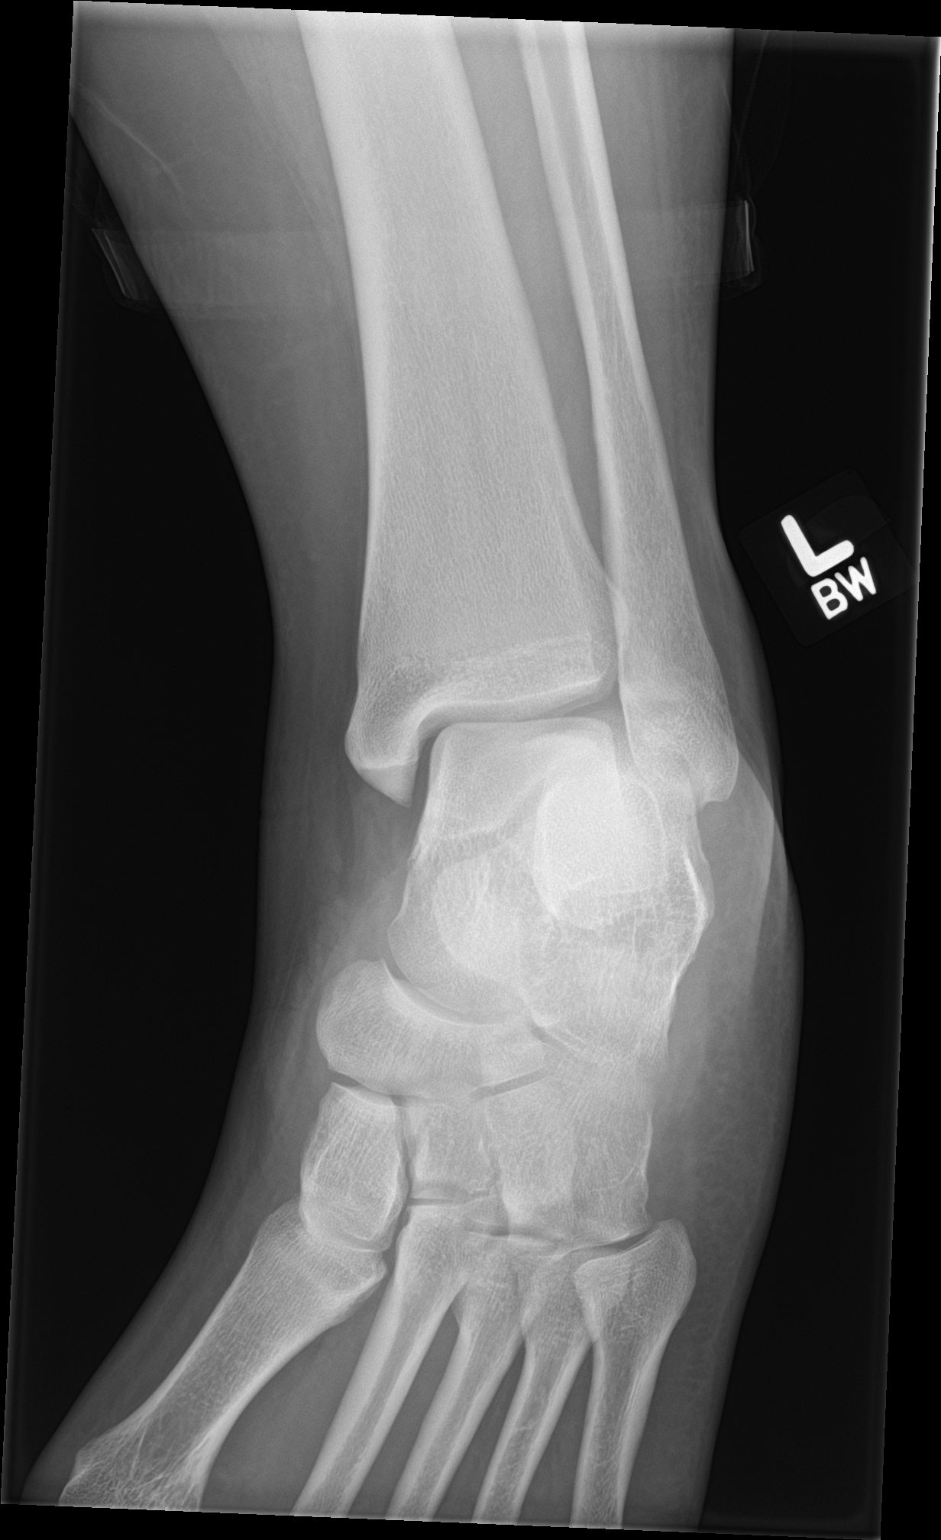

[ankle lat]
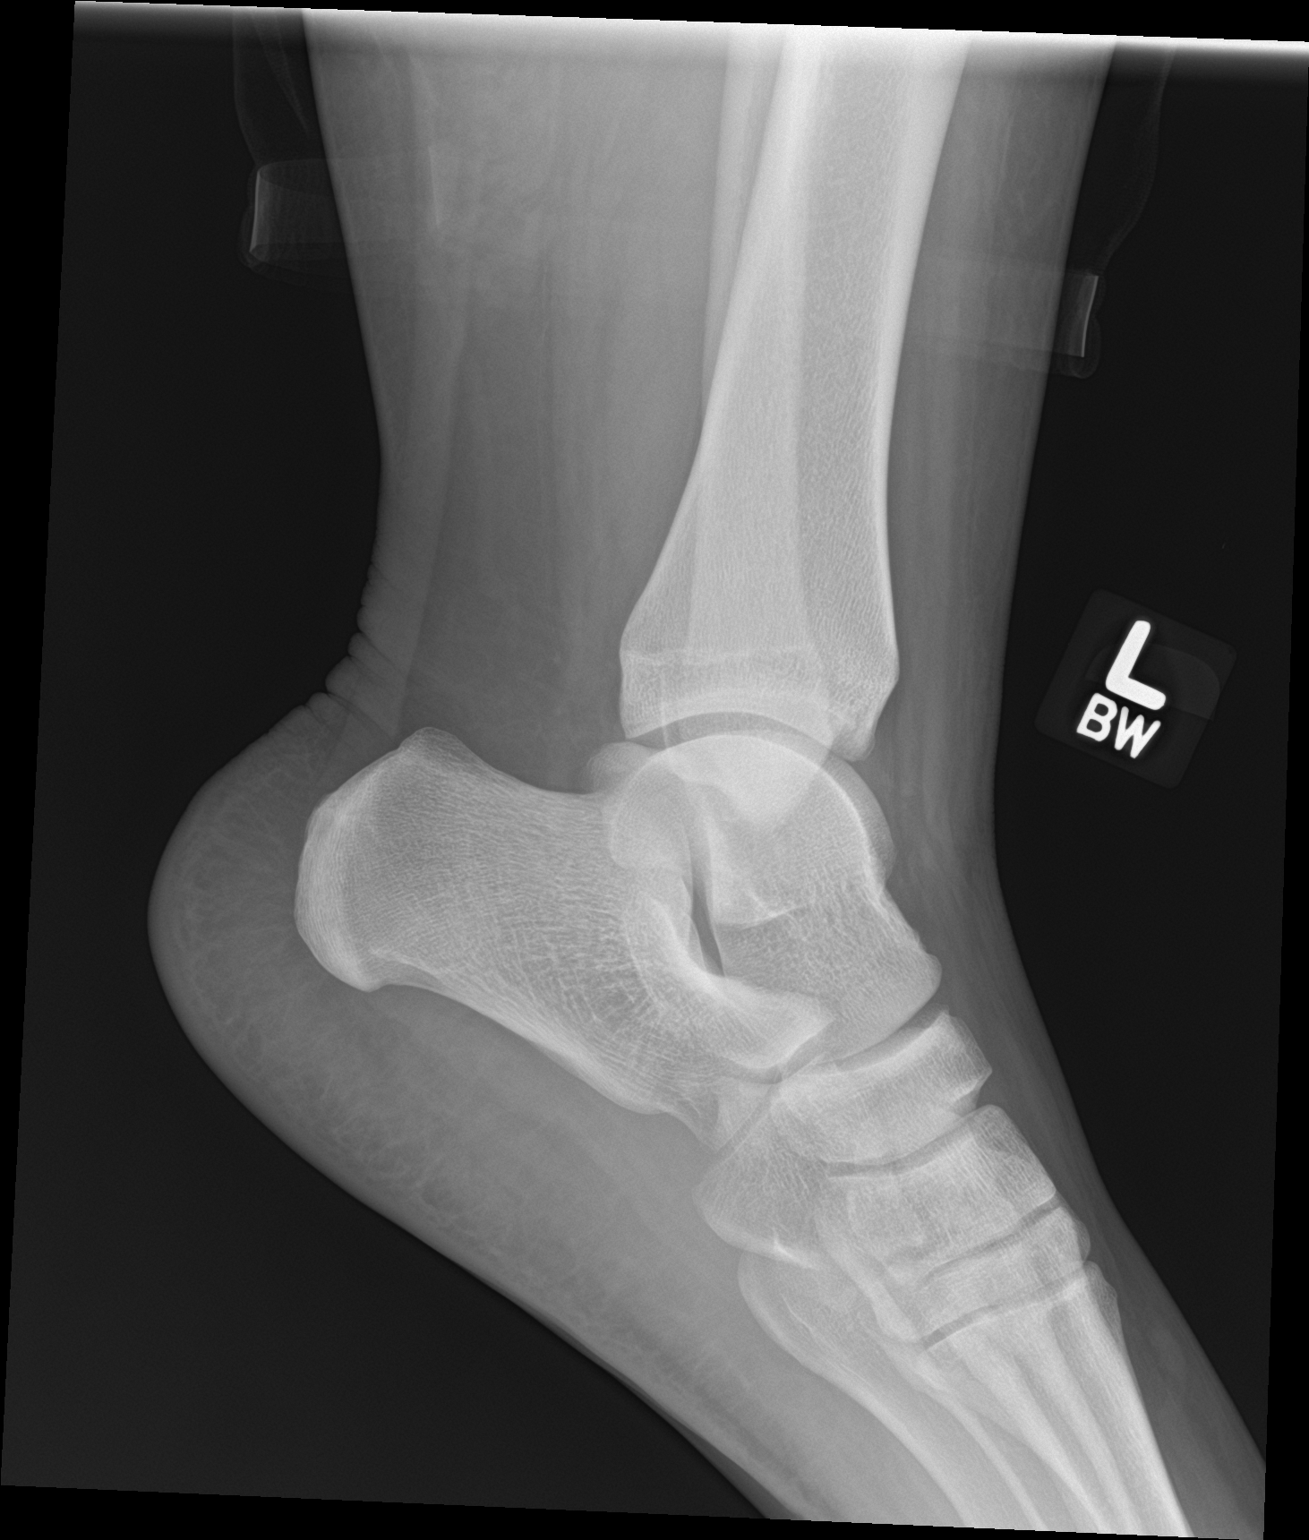

[3 of 3 positions shown; findings below may reference images not displayed]

FINDINGS: There is no evidence of fracture, dislocation, or joint effusion.
There is no evidence of arthropathy or other focal bone abnormality.
Soft tissues are unremarkable.
IMPRESSION: No fracture or dislocation of the left ankle. Joint spaces are well
preserved.

## 2020-11-18 ENCOUNTER — Other Ambulatory Visit: Payer: Self-pay

## 2020-11-18 ENCOUNTER — Emergency Department (HOSPITAL_COMMUNITY)
Admission: EM | Admit: 2020-11-18 | Discharge: 2020-11-18 | Disposition: A | Discharge status: Home / Self Care | Payer: Medicaid Other | Attending: Emergency Medicine | Admitting: Emergency Medicine

## 2020-11-18 ENCOUNTER — Encounter (HOSPITAL_COMMUNITY): Payer: Self-pay | Admitting: *Deleted

## 2020-11-18 DIAGNOSIS — M791 Myalgia, unspecified site: Secondary | ICD-10-CM | POA: Insufficient documentation

## 2020-11-18 DIAGNOSIS — Z5321 Procedure and treatment not carried out due to patient leaving prior to being seen by health care provider: Secondary | ICD-10-CM | POA: Insufficient documentation

## 2020-11-18 DIAGNOSIS — R1110 Vomiting, unspecified: Secondary | ICD-10-CM | POA: Insufficient documentation

## 2020-11-18 NOTE — ED Triage Notes (Signed)
Pt with emesis starting today x 3.  Denies diarrhea.  C/o body aches as well. Denies any known Covid exposure

## 2020-11-18 NOTE — ED Notes (Signed)
Pt currently eating pizza in the lobby.

## 2020-11-18 NOTE — ED Notes (Signed)
Pt states she took tylenol about 3 hours ago.

## 2021-05-22 ENCOUNTER — Ambulatory Visit
Admission: EM | Admit: 2021-05-22 | Discharge: 2021-05-22 | Disposition: A | Discharge status: Home / Self Care | Payer: Medicaid Other | Attending: Family Medicine | Admitting: Family Medicine

## 2021-05-22 ENCOUNTER — Encounter: Payer: Self-pay | Admitting: Emergency Medicine

## 2021-05-22 ENCOUNTER — Other Ambulatory Visit: Payer: Self-pay

## 2021-05-22 DIAGNOSIS — N760 Acute vaginitis: Secondary | ICD-10-CM | POA: Diagnosis not present

## 2021-05-22 LAB — POCT URINALYSIS DIP (MANUAL ENTRY)
Bilirubin, UA: NEGATIVE
Glucose, UA: NEGATIVE mg/dL
Ketones, POC UA: NEGATIVE mg/dL
Leukocytes, UA: NEGATIVE
Nitrite, UA: NEGATIVE
Protein Ur, POC: NEGATIVE mg/dL
Spec Grav, UA: 1.025 (ref 1.010–1.025)
Urobilinogen, UA: 0.2 E.U./dL
pH, UA: 6 (ref 5.0–8.0)

## 2021-05-22 LAB — POCT URINE PREGNANCY: Preg Test, Ur: NEGATIVE

## 2021-05-22 MED ORDER — FLUCONAZOLE 150 MG PO TABS: 150.0000 mg | ORAL_TABLET | Freq: Once | ORAL | 0 refills | Status: AC

## 2021-05-22 NOTE — ED Provider Notes (Signed)
[facility]    CSN: [id number] Arrival date & time: 05/22/21  1358      History   Chief Complaint Chief Complaint  Patient presents with   Exposure to STD    HPI Veronica Watts is a 25 y.o. female.   HPI Patient presents today for STI screening.  Patient reports overnight she developed itching and vaginal irritation.  She denies any changes to her vaginal discharge.  Medical history significant for menorrhalgia and she has been seen previous for this at the OB/GYN office however she was unaware that she had an active prescription for Lysteda to help with menstrual cycle. Reports over the last couple months irregular and heavy bleeding. Denies any known exposure to any STIs.  She is sexually active with 1 partner only.  History reviewed. No pertinent past medical history.  Patient Active Problem List   Diagnosis Date Noted   Menorrhagia with regular cycle 10/27/2020   Encounter for surveillance of contraceptives 08/12/2017   Breakthrough bleeding on Nexplanon 05/15/2017    History reviewed. No pertinent surgical history.  OB History     Gravida  0   Para  0   Term  0   Preterm  0   AB  0   Living  0      SAB  0   IAB  0   Ectopic  0   Multiple  0   Live Births  0            Home Medications    Prior to Admission medications   Medication Sig Start Date End Date Taking? Authorizing Provider  fluconazole (DIFLUCAN) 150 MG tablet Take 1 tablet (150 mg total) by mouth once for 1 dose. Repeat  2nd dose in 3 days as needed. 05/22/21 05/22/21 Yes Bing Neighbors, FNP  ferrous sulfate 325 (65 FE) MG tablet Take 1 tablet (325 mg total) by mouth daily. Patient not taking: Reported on 10/27/2020 10/11/20   Jacalyn Lefevre, MD  ibuprofen (ADVIL) 600 MG tablet Take 1 tablet (600 mg total) by mouth every 6 (six) hours as needed. Patient not taking: Reported on 10/27/2020 11/29/19   Burgess Amor, PA-C  ibuprofen (ADVIL) 600 MG tablet Take 1 tablet (600  mg total) by mouth every 6 (six) hours as needed. 10/11/20   Jacalyn Lefevre, MD  medroxyPROGESTERone (PROVERA) 5 MG tablet Take 1 tablet (5 mg total) by mouth daily. Patient not taking: Reported on 10/27/2020 10/11/20   Jacalyn Lefevre, MD  Prenatal Vit-Fe Fumarate-FA (PNV PRENATAL PLUS MULTIVITAMIN) 27-1 MG TABS Take 1 tablet by mouth daily. 10/27/20   Cresenzo-Dishmon, Scarlette Calico, CNM  tranexamic acid (LYSTEDA) 650 MG TABS tablet Take 2 tablets (1,300 mg total) by mouth 3 (three) times daily. 10/27/20   Cresenzo-Dishmon, Scarlette Calico, CNM    Family History Family History  Problem Relation Age of Onset   Diabetes Paternal Grandmother    Diabetes Maternal Grandmother    Cancer Maternal Grandmother        throat   Diabetes Father     Social History Social History   Tobacco Use   Smoking status: Every Day    Types: Cigarettes   Smokeless tobacco: Never  Substance Use Topics   Alcohol use: No   Drug use: No     Allergies   Patient has no known allergies.   Review of Systems Review of Systems Pertinent negatives listed in HPI   Physical Exam Triage Vital Signs ED Triage Vitals  Enc  Vitals Group     BP 05/22/21 1545 116/73     Pulse Rate 05/22/21 1545 78     Resp 05/22/21 1545 16     Temp 05/22/21 1545 97.8 F (36.6 C)     Temp Source 05/22/21 1545 Tympanic     SpO2 05/22/21 1545 97 %     Weight --      Height --      Head Circumference --      Peak Flow --      Pain Score 05/22/21 1549 0     Pain Loc --      Pain Edu? --      Excl. in GC? --    No data found.  Updated Vital Signs BP 116/73 (BP Location: Right Arm)   Pulse 78   Temp 97.8 F (36.6 C) (Tympanic)   Resp 16   LMP 05/11/2021   SpO2 97%   Visual Acuity Right Eye Distance:   Left Eye Distance:   Bilateral Distance:    Right Eye Near:   Left Eye Near:    Bilateral Near:     Physical Exam  General appearance: Alert, well developed, well nourished, cooperative  Head: Normocephalic, without  obvious abnormality, atraumatic Respiratory: Respirations even and unlabored, normal respiratory rate Heart: Rate and rhythm normal. No gallop or murmurs noted on exam  Extremities: No gross deformities Skin: Skin color, texture, turgor normal. No rashes seen  Psych: Appropriate mood and affect. Neurologic: GCS 15, normal coordination normal gait Vaginal cytology self collected.  UC Treatments / Results  Labs (all labs ordered are listed, but only abnormal results are displayed) Labs Reviewed  POCT URINALYSIS DIP (MANUAL ENTRY) - Abnormal; Notable for the following components:      Result Value   Blood, UA small (*)    All other components within normal limits  POCT URINE PREGNANCY  CERVICOVAGINAL ANCILLARY ONLY    EKG   Radiology No results found.  Procedures Procedures (including critical care time)  Medications Ordered in UC Medications - No data to display  Initial Impression / Assessment and Plan / UC Course  I have reviewed the triage vital signs and the nursing notes.  Pertinent labs & imaging results that were available during my care of the patient were reviewed by me and considered in my medical decision making (see chart for details).     Vaginal cytology pending.  hCG pregnancy negative. UA negative.  Treating for acute vaginitis with Diflucan.  Patient advised we will notify her of any abnormal cytology results. Final Clinical Impressions(s) / UC Diagnoses   Final diagnoses:  Vaginitis and vulvovaginitis     Discharge Instructions      Vaginal cytology will result within 24-48 hours. In the meantime start diflucan 150 mg today and repeat second dose in 3 days. Follow-up with OBGYN regarding concern related to vaginal bleeding.     ED Prescriptions     Medication Sig Dispense Auth. Provider   fluconazole (DIFLUCAN) 150 MG tablet Take 1 tablet (150 mg total) by mouth once for 1 dose. Repeat  2nd dose in 3 days as needed. 2 tablet Bing Neighbors, FNP      PDMP not reviewed this encounter.   Bing Neighbors, FNP 05/22/21 1640

## 2021-05-22 NOTE — ED Notes (Signed)
Pt states she is having some mild vaginal bleeding at this time

## 2021-05-22 NOTE — Discharge Instructions (Addendum)
Vaginal cytology will result within 24-48 hours. In the meantime start diflucan 150 mg today and repeat second dose in 3 days. Follow-up with OBGYN regarding concern related to vaginal bleeding.

## 2021-05-22 NOTE — ED Triage Notes (Addendum)
Itching to vaginal area that started yesterday. Also reports brown blood for 2 weeks before menstrual cycle starts x 3-4 months, seen [facility] the health dept for the bleeding and states they didn't do anything.  Also reports burning with urination.

## 2021-05-23 LAB — CERVICOVAGINAL ANCILLARY ONLY
Bacterial Vaginitis (gardnerella): POSITIVE — AB
Candida Glabrata: NEGATIVE
Candida Vaginitis: NEGATIVE
Chlamydia: NEGATIVE
Comment: NEGATIVE
Comment: NEGATIVE
Comment: NEGATIVE
Comment: NEGATIVE
Comment: NEGATIVE
Comment: NORMAL
Neisseria Gonorrhea: NEGATIVE
Trichomonas: NEGATIVE

## 2021-05-24 ENCOUNTER — Telehealth (HOSPITAL_COMMUNITY): Payer: Self-pay | Admitting: Emergency Medicine

## 2021-05-24 MED ORDER — METRONIDAZOLE 500 MG PO TABS: 500.0000 mg | ORAL_TABLET | Freq: Two times a day (BID) | ORAL | 0 refills | Status: DC

## 2021-06-12 ENCOUNTER — Encounter: Payer: Self-pay | Admitting: *Deleted

## 2021-06-12 ENCOUNTER — Ambulatory Visit
Admission: EM | Admit: 2021-06-12 | Discharge: 2021-06-12 | Disposition: A | Discharge status: Home / Self Care | Payer: Medicaid Other | Attending: Internal Medicine | Admitting: Internal Medicine

## 2021-06-12 ENCOUNTER — Other Ambulatory Visit: Payer: Self-pay

## 2021-06-12 DIAGNOSIS — N760 Acute vaginitis: Secondary | ICD-10-CM | POA: Insufficient documentation

## 2021-06-12 LAB — POCT URINE PREGNANCY: Preg Test, Ur: NEGATIVE

## 2021-06-12 MED ORDER — METRONIDAZOLE 500 MG PO TABS: 500.0000 mg | ORAL_TABLET | Freq: Two times a day (BID) | ORAL | 0 refills | Status: DC

## 2021-06-12 MED ORDER — FLUCONAZOLE 150 MG PO TABS: 150.0000 mg | ORAL_TABLET | Freq: Once | ORAL | 0 refills | Status: AC

## 2021-06-12 NOTE — ED Triage Notes (Signed)
Pt reports same Sx's as on last visit 05-22-21 . Pt has a cream colored Vag discharge . Pt also reports vaginal pain.

## 2021-06-12 NOTE — ED Provider Notes (Signed)
[facility]    CSN: [id number] Arrival date & time: 06/12/21  1049      History   Chief Complaint Chief Complaint  Patient presents with   Vaginal Discharge   Vaginal Pain    HPI Veronica Watts is a 25 y.o. female.   HPI Patient presents today with symptoms of vaginitis. Seen here on 05/22/2021 with the same symptoms and vaginal cytology revealed bacterial vaginosis. Patient treated with diflucan and metronidazole.   History reviewed. No pertinent past medical history.  Patient Active Problem List   Diagnosis Date Noted   Menorrhagia with regular cycle 10/27/2020   Encounter for surveillance of contraceptives 08/12/2017   Breakthrough bleeding on Nexplanon 05/15/2017    History reviewed. No pertinent surgical history.  OB History     Gravida  0   Para  0   Term  0   Preterm  0   AB  0   Living  0      SAB  0   IAB  0   Ectopic  0   Multiple  0   Live Births  0            Home Medications    Prior to Admission medications   Medication Sig Start Date End Date Taking? Authorizing Provider  fluconazole (DIFLUCAN) 150 MG tablet Take 1 tablet (150 mg total) by mouth once for 1 dose. Repeat if needed 06/12/21 06/12/21 Yes Bing Neighbors, FNP  ferrous sulfate 325 (65 FE) MG tablet Take 1 tablet (325 mg total) by mouth daily. Patient not taking: Reported on 10/27/2020 10/11/20   Jacalyn Lefevre, MD  ibuprofen (ADVIL) 600 MG tablet Take 1 tablet (600 mg total) by mouth every 6 (six) hours as needed. Patient not taking: Reported on 10/27/2020 11/29/19   Burgess Amor, PA-C  ibuprofen (ADVIL) 600 MG tablet Take 1 tablet (600 mg total) by mouth every 6 (six) hours as needed. 10/11/20   Jacalyn Lefevre, MD  medroxyPROGESTERone (PROVERA) 5 MG tablet Take 1 tablet (5 mg total) by mouth daily. Patient not taking: Reported on 10/27/2020 10/11/20   Jacalyn Lefevre, MD  metroNIDAZOLE (FLAGYL) 500 MG tablet Take 1 tablet (500 mg total) by mouth 2 (two)  times daily. 06/12/21   Bing Neighbors, FNP  Prenatal Vit-Fe Fumarate-FA (PNV PRENATAL PLUS MULTIVITAMIN) 27-1 MG TABS Take 1 tablet by mouth daily. 10/27/20   Cresenzo-Dishmon, Scarlette Calico, CNM  tranexamic acid (LYSTEDA) 650 MG TABS tablet Take 2 tablets (1,300 mg total) by mouth 3 (three) times daily. 10/27/20   Cresenzo-Dishmon, Scarlette Calico, CNM    Family History Family History  Problem Relation Age of Onset   Diabetes Paternal Grandmother    Diabetes Maternal Grandmother    Cancer Maternal Grandmother        throat   Diabetes Father     Social History Social History   Tobacco Use   Smoking status: Every Day    Types: Cigarettes   Smokeless tobacco: Never  Substance Use Topics   Alcohol use: No   Drug use: No     Allergies   Patient has no known allergies.   Review of Systems Review of Systems Pertinent negatives listed in HPI   Physical Exam Triage Vital Signs ED Triage Vitals  Enc Vitals Group     BP 06/12/21 1144 120/81     Pulse Rate 06/12/21 1144 71     Resp 06/12/21 1144 16     Temp 06/12/21 1144 98.4 F (  36.9 C)     Temp src --      SpO2 06/12/21 1144 98 %     Weight --      Height --      Head Circumference --      Peak Flow --      Pain Score 06/12/21 1145 10     Pain Loc --      Pain Edu? --      Excl. in GC? --    No data found.  Updated Vital Signs BP 120/81   Pulse 71   Temp 98.4 F (36.9 C)   Resp 16   LMP 05/02/2021 (Exact Date)   SpO2 98%   Visual Acuity Right Eye Distance:   Left Eye Distance:   Bilateral Distance:    Right Eye Near:   Left Eye Near:    Bilateral Near:     Physical Exam General appearance: Alert, well developed, well nourished, cooperative  Head: Normocephalic, without obvious abnormality, atraumatic Respiratory: Respirations even and unlabored, normal respiratory rate Heart: Rate and rhythm normal.  Extremities: No gross deformities Skin: Skin color, texture, turgor normal. No rashes seen  Psych:  Appropriate mood and affect. Neurologic: GCS 15, normal coordination, normal gait UC Treatments / Results  Labs (all labs ordered are listed, but only abnormal results are displayed) Labs Reviewed  POCT URINE PREGNANCY  CERVICOVAGINAL ANCILLARY ONLY    EKG   Radiology No results found.  Procedures Procedures (including critical care time)  Medications Ordered in UC Medications - No data to display  Initial Impression / Assessment and Plan / UC Course  I have reviewed the triage vital signs and the nursing notes.  Pertinent labs & imaging results that were available during my care of the patient were reviewed by me and considered in my medical decision making (see chart for details).     Vaginitis treatment today with metronidazole and fluconazole.  Advised patient to schedule appointment with OB/GYN for full work-up and treatment of recurrent vaginitis. Final Clinical Impressions(s) / UC Diagnoses   Final diagnoses:  Vaginitis and vulvovaginitis   Discharge Instructions   None    ED Prescriptions     Medication Sig Dispense Auth. Provider   metroNIDAZOLE (FLAGYL) 500 MG tablet Take 1 tablet (500 mg total) by mouth 2 (two) times daily. 14 tablet Bing Neighbors, FNP   fluconazole (DIFLUCAN) 150 MG tablet Take 1 tablet (150 mg total) by mouth once for 1 dose. Repeat if needed 2 tablet Bing Neighbors, FNP      PDMP not reviewed this encounter.   Bing Neighbors, FNP 06/12/21 [phone]

## 2021-06-13 LAB — CERVICOVAGINAL ANCILLARY ONLY
Bacterial Vaginitis (gardnerella): POSITIVE — AB
Candida Glabrata: NEGATIVE
Candida Vaginitis: POSITIVE — AB
Chlamydia: NEGATIVE
Comment: NEGATIVE
Comment: NEGATIVE
Comment: NEGATIVE
Comment: NEGATIVE
Comment: NEGATIVE
Comment: NORMAL
Neisseria Gonorrhea: NEGATIVE
Trichomonas: NEGATIVE

## 2021-06-19 ENCOUNTER — Ambulatory Visit: Payer: Medicaid Other | Admitting: Adult Health

## 2021-06-21 ENCOUNTER — Ambulatory Visit (INDEPENDENT_AMBULATORY_CARE_PROVIDER_SITE_OTHER): Payer: Medicaid Other | Admitting: Advanced Practice Midwife

## 2021-06-21 ENCOUNTER — Telehealth: Payer: Self-pay | Admitting: Advanced Practice Midwife

## 2021-06-21 ENCOUNTER — Other Ambulatory Visit: Payer: Self-pay | Admitting: Advanced Practice Midwife

## 2021-06-21 ENCOUNTER — Other Ambulatory Visit: Payer: Self-pay

## 2021-06-21 DIAGNOSIS — N898 Other specified noninflammatory disorders of vagina: Secondary | ICD-10-CM

## 2021-06-21 MED ORDER — FLUCONAZOLE 150 MG PO TABS: 150.0000 mg | ORAL_TABLET | Freq: Once | ORAL | 0 refills | Status: AC

## 2021-06-21 MED ORDER — METRONIDAZOLE 0.75 % VA GEL: 1.0000 | Freq: Every day | VAGINAL | 0 refills | Status: AC

## 2021-06-21 NOTE — Telephone Encounter (Signed)
Pt originally for visit with me today for eval of vag d/c, however she had an Urgent Care visit 06/12/21 with results +BV and +yeast. Didn't realize meds had been called in for BV. She took MTX at the beginning of Aug for BV and feels like it didn't help. Metrogel qhs x 7 sent in as well as Diflucan 150mg  to take after Metrogel treatment. She doesn't have any questions for me and prefers to cancel her appt today.  CNM 06/21/2021 3:32 PM

## 2021-09-12 ENCOUNTER — Other Ambulatory Visit: Payer: Self-pay

## 2021-09-12 ENCOUNTER — Ambulatory Visit
Admission: EM | Admit: 2021-09-12 | Discharge: 2021-09-12 | Disposition: A | Discharge status: Home / Self Care | Payer: Medicaid Other | Attending: Student | Admitting: Student

## 2021-09-12 DIAGNOSIS — N760 Acute vaginitis: Secondary | ICD-10-CM | POA: Insufficient documentation

## 2021-09-12 MED ORDER — FLUCONAZOLE 150 MG PO TABS: 150.0000 mg | ORAL_TABLET | Freq: Every day | ORAL | 0 refills | Status: DC

## 2021-09-12 MED ORDER — METRONIDAZOLE 500 MG PO TABS: 500.0000 mg | ORAL_TABLET | Freq: Two times a day (BID) | ORAL | 0 refills | Status: DC

## 2021-09-12 NOTE — ED Provider Notes (Signed)
[facility]    CSN: [id number] Arrival date & time: 09/12/21  1313      History   Chief Complaint Chief Complaint  Patient presents with   Vaginal Itching    HPI Veronica Watts is a 25 y.o. female presenting with vaginitis for about 1 week.  Medical history BV and yeast, last visit with [place] was 8/22 for this.  Endorses external vaginal irritation and itching, as well as redness.  Thick brown discharge.  Crampy lower abdominal pain.  She states that she tried 2 pills of Diflucan that she had at home, this did not help with the symptoms.  Also tried 1 dose of MetroGel last night, this seemed to only make the burning worse.  Dysuria.  Denies new partners or STI risk.  Denies flank pain, fever/chills, vaginal rashes or lesions. States she is not pregnant or breastfeeding.   HPI  History reviewed. No pertinent past medical history.  Patient Active Problem List   Diagnosis Date Noted   Menorrhagia with regular cycle 10/27/2020   Encounter for surveillance of contraceptives 08/12/2017   Breakthrough bleeding on Nexplanon 05/15/2017    History reviewed. No pertinent surgical history.  OB History     Gravida  0   Para  0   Term  0   Preterm  0   AB  0   Living  0      SAB  0   IAB  0   Ectopic  0   Multiple  0   Live Births  0            Home Medications    Prior to Admission medications   Medication Sig Start Date End Date Taking? Authorizing Provider  fluconazole (DIFLUCAN) 150 MG tablet Take 1 tablet (150 mg total) by mouth daily. -For your yeast infection, start the Diflucan (fluconazole)- Take one pill today (day 1). If you're still having symptoms in 3 days, take the second pill.  If you're still having symptoms in 3 days, take the third pill. 09/12/21  Yes Rhys Martini, PA-C  metroNIDAZOLE (FLAGYL) 500 MG tablet Take 1 tablet (500 mg total) by mouth 2 (two) times daily. 09/12/21  Yes Rhys Martini, PA-C  ferrous sulfate 325 (65  FE) MG tablet Take 1 tablet (325 mg total) by mouth daily. Patient not taking: Reported on 10/27/2020 10/11/20   Jacalyn Lefevre, MD  ibuprofen (ADVIL) 600 MG tablet Take 1 tablet (600 mg total) by mouth every 6 (six) hours as needed. Patient not taking: Reported on 10/27/2020 11/29/19   Burgess Amor, PA-C  ibuprofen (ADVIL) 600 MG tablet Take 1 tablet (600 mg total) by mouth every 6 (six) hours as needed. 10/11/20   Jacalyn Lefevre, MD  medroxyPROGESTERone (PROVERA) 5 MG tablet Take 1 tablet (5 mg total) by mouth daily. Patient not taking: Reported on 10/27/2020 10/11/20   Jacalyn Lefevre, MD  Prenatal Vit-Fe Fumarate-FA (PNV PRENATAL PLUS MULTIVITAMIN) 27-1 MG TABS Take 1 tablet by mouth daily. 10/27/20   Cresenzo-Dishmon, Scarlette Calico, CNM  tranexamic acid (LYSTEDA) 650 MG TABS tablet Take 2 tablets (1,300 mg total) by mouth 3 (three) times daily. 10/27/20   Jacklyn Shell, CNM    Family History Family History  Problem Relation Age of Onset   Diabetes Paternal Grandmother    Diabetes Maternal Grandmother    Cancer Maternal Grandmother        throat   Diabetes Father     Social History Social History  Tobacco Use   Smoking status: Every Day    Types: Cigarettes   Smokeless tobacco: Never  Substance Use Topics   Alcohol use: No   Drug use: No     Allergies   Patient has no known allergies.   Review of Systems Review of Systems  Constitutional:  Negative for chills and fever.  HENT:  Negative for sore throat.   Eyes:  Negative for pain and redness.  Respiratory:  Negative for shortness of breath.   Cardiovascular:  Negative for chest pain.  Gastrointestinal:  Positive for abdominal pain. Negative for diarrhea, nausea and vomiting.  Genitourinary:  Positive for vaginal discharge. Negative for decreased urine volume, difficulty urinating, dysuria, flank pain, frequency, genital sores, hematuria and urgency.  Musculoskeletal:  Negative for back pain.  Skin:  Negative for rash.   All other systems reviewed and are negative.   Physical Exam Triage Vital Signs ED Triage Vitals  Enc Vitals Group     BP 09/12/21 1510 125/80     Pulse Rate 09/12/21 1510 83     Resp 09/12/21 1510 20     Temp 09/12/21 1510 98.7 F (37.1 C)     Temp src --      SpO2 09/12/21 1510 98 %     Weight --      Height --      Head Circumference --      Peak Flow --      Pain Score 09/12/21 1509 0     Pain Loc --      Pain Edu? --      Excl. in [place]? --    No data found.  Updated Vital Signs BP 125/80   Pulse 83   Temp 98.7 F (37.1 C)   Resp 20   LMP 08/29/2021   SpO2 98%   Visual Acuity Right Eye Distance:   Left Eye Distance:   Bilateral Distance:    Right Eye Near:   Left Eye Near:    Bilateral Near:     Physical Exam Vitals reviewed.  Constitutional:      General: She is not in acute distress.    Appearance: Normal appearance. She is not ill-appearing.  HENT:     Head: Normocephalic and atraumatic.     Mouth/Throat:     Mouth: Mucous membranes are moist.     Comments: Moist mucous membranes Eyes:     Extraocular Movements: Extraocular movements intact.     Pupils: Pupils are equal, round, and reactive to light.  Cardiovascular:     Rate and Rhythm: Normal rate and regular rhythm.     Heart sounds: Normal heart sounds.  Pulmonary:     Effort: Pulmonary effort is normal.     Breath sounds: Normal breath sounds. No wheezing, rhonchi or rales.  Abdominal:     General: Bowel sounds are normal. There is no distension.     Palpations: Abdomen is soft. There is no mass.     Tenderness: There is no abdominal tenderness. There is no right CVA tenderness, left CVA tenderness, guarding or rebound.  Genitourinary:    Comments: deferred Skin:    General: Skin is warm.     Capillary Refill: Capillary refill takes less than 2 seconds.     Comments: Good skin turgor  Neurological:     General: No focal deficit present.     Mental Status: She is alert and oriented  to person, place, and time.  Psychiatric:  Mood and Affect: Mood normal.        Behavior: Behavior normal.     UC Treatments / Results  Labs (all labs ordered are listed, but only abnormal results are displayed) Labs Reviewed  CERVICOVAGINAL ANCILLARY ONLY    EKG   Radiology No results found.  Procedures Procedures (including critical care time)  Medications Ordered in UC Medications - No data to display  Initial Impression / Assessment and Plan / UC Course  I have reviewed the triage vital signs and the nursing notes.  Pertinent labs & imaging results that were available during my care of the patient were reviewed by me and considered in my medical decision making (see chart for details).     This patient is a very pleasant 25 y.o. year old female presenting with vaginitis. Afebrile, nontachycardic, no reproducible abd pain or CVAT.  Denies STI risk. Will send self-swab for G/C, trich, yeast, BV testing. Declines HIV, RPR. Safe sex precautions.  States she is not pregnant or breastfeeding.  Suspect concurrent BV and yeast. Diflucan and flagyl PO sent .  ED return precautions discussed. Patient verbalizes understanding and agreement.  .   Final Clinical Impressions(s) / UC Diagnoses   Final diagnoses:  Vaginitis and vulvovaginitis     Discharge Instructions      -For bacterial vaginosis, start the antibiotic-Flagyl (metronidazole), 2 pills daily for 7 days.  You can take this with food if you have a sensitive stomach.  Avoid alcohol while taking this medication and for 2 days after as this will cause severe nausea and vomiting. -For your yeast infection, start the Diflucan (fluconazole)- Take one pill today (day 1). If you're still having symptoms in 3 days, take the second pill.  If you're still having symptoms in 3 days, take the third pill.  -We'll call in about 2 days if we need to change your medications following test results    ED Prescriptions      Medication Sig Dispense Auth. Provider   metroNIDAZOLE (FLAGYL) 500 MG tablet Take 1 tablet (500 mg total) by mouth 2 (two) times daily. 14 tablet Rhys Martini, PA-C   fluconazole (DIFLUCAN) 150 MG tablet Take 1 tablet (150 mg total) by mouth daily. -For your yeast infection, start the Diflucan (fluconazole)- Take one pill today (day 1). If you're still having symptoms in 3 days, take the second pill.  If you're still having symptoms in 3 days, take the third pill. 3 tablet Rhys Martini, PA-C      PDMP not reviewed this encounter.   Rhys Martini, PA-C 09/12/21 1554

## 2021-09-12 NOTE — Discharge Instructions (Addendum)
-  For bacterial vaginosis, start the antibiotic-Flagyl (metronidazole), 2 pills daily for 7 days.  You can take this with food if you have a sensitive stomach.  Avoid alcohol while taking this medication and for 2 days after as this will cause severe nausea and vomiting. -For your yeast infection, start the Diflucan (fluconazole)- Take one pill today (day 1). If you're still having symptoms in 3 days, take the second pill.  If you're still having symptoms in 3 days, take the third pill.  -We'll call in about 2 days if we need to change your medications following test results

## 2021-09-12 NOTE — ED Triage Notes (Signed)
Pt presents with c/o vaginal redness and itching with non odorous discharge not responding to medication for yeast infection

## 2021-09-13 LAB — CERVICOVAGINAL ANCILLARY ONLY
Bacterial Vaginitis (gardnerella): POSITIVE — AB
Candida Glabrata: NEGATIVE
Candida Vaginitis: POSITIVE — AB
Chlamydia: NEGATIVE
Comment: NEGATIVE
Comment: NEGATIVE
Comment: NEGATIVE
Comment: NEGATIVE
Comment: NEGATIVE
Comment: NORMAL
Neisseria Gonorrhea: NEGATIVE
Trichomonas: NEGATIVE

## 2022-03-21 ENCOUNTER — Emergency Department (HOSPITAL_COMMUNITY): Payer: Medicaid Other

## 2022-03-21 ENCOUNTER — Emergency Department (HOSPITAL_COMMUNITY)
Admission: EM | Admit: 2022-03-21 | Discharge: 2022-03-21 | Disposition: A | Discharge status: Home / Self Care | Payer: Medicaid Other | Attending: Emergency Medicine | Admitting: Emergency Medicine

## 2022-03-21 ENCOUNTER — Other Ambulatory Visit: Payer: Self-pay

## 2022-03-21 DIAGNOSIS — F17210 Nicotine dependence, cigarettes, uncomplicated: Secondary | ICD-10-CM | POA: Insufficient documentation

## 2022-03-21 DIAGNOSIS — R072 Precordial pain: Secondary | ICD-10-CM | POA: Insufficient documentation

## 2022-03-21 DIAGNOSIS — R0789 Other chest pain: Secondary | ICD-10-CM

## 2022-03-21 LAB — BASIC METABOLIC PANEL
Anion gap: 5 (ref 5–15)
BUN: 9 mg/dL (ref 6–20)
CO2: 25 mmol/L (ref 22–32)
Calcium: 9 mg/dL (ref 8.9–10.3)
Chloride: 108 mmol/L (ref 98–111)
Creatinine, Ser: 0.77 mg/dL (ref 0.44–1.00)
GFR, Estimated: >60 mL/min (ref >60)
Glucose, Bld: 95 mg/dL (ref 70–99)
Potassium: 4.1 mmol/L (ref 3.5–5.1)
Sodium: 138 mmol/L (ref 135–145)

## 2022-03-21 LAB — CBC
HCT: 40 % (ref 36.0–46.0)
Hemoglobin: 12.6 g/dL (ref 12.0–15.0)
MCH: 28.6 pg (ref 26.0–34.0)
MCHC: 31.5 g/dL (ref 30.0–36.0)
MCV: 90.9 fL (ref 80.0–100.0)
Platelets: 454 10*3/uL — ABNORMAL HIGH (ref 150–400)
RBC: 4.4 MIL/uL (ref 3.87–5.11)
RDW: 13.1 % (ref 11.5–15.5)
WBC: 6.5 10*3/uL (ref 4.0–10.5)
nRBC: 0 % (ref 0.0–0.2)

## 2022-03-21 LAB — I-STAT BETA HCG BLOOD, ED (MC, WL, AP ONLY): I-stat hCG, quantitative: <5 m[IU]/mL (ref <5)

## 2022-03-21 LAB — TROPONIN I (HIGH SENSITIVITY)
Troponin I (High Sensitivity): <2 ng/L (ref <18)
Troponin I (High Sensitivity): <2 ng/L (ref <18)

## 2022-03-21 MED ORDER — PREDNISONE 20 MG PO TABS: 40.0000 mg | ORAL_TABLET | Freq: Every day | ORAL | 0 refills | Status: DC

## 2022-03-21 MED ORDER — KETOROLAC TROMETHAMINE 30 MG/ML IJ SOLN
30.0000 mg | Freq: Once | INTRAMUSCULAR | Status: AC
  Administered 2022-03-21: 30 mg via INTRAVENOUS
  Filled 2022-03-21: qty 1

## 2022-03-21 NOTE — ED Provider Notes (Signed)
[facility] EMERGENCY DEPARTMENT Provider Note   CSN: [id number] Arrival date & time: 03/21/22  1550     History  Chief Complaint  Patient presents with   Chest Pain    Veronica Watts is a 26 y.o. female.  HPI  Patient presents with chest pain.  Pain began about 3 days ago, since that time has been sternal, sore, worse with motion.  No dyspnea, no fever, no vomiting, no diarrhea.  No medication taken for relief.  She is unaware of family history of cardiac disease, smokes, but is trying to quit.  Home Medications Prior to Admission medications   Medication Sig Start Date End Date Taking? Authorizing Provider  predniSONE (DELTASONE) 20 MG tablet Take 2 tablets (40 mg total) by mouth daily with breakfast. For the next four days 03/21/22  Yes Gerhard Munch, MD  ferrous sulfate 325 (65 FE) MG tablet Take 1 tablet (325 mg total) by mouth daily. Patient not taking: Reported on 10/27/2020 10/11/20   Jacalyn Lefevre, MD  fluconazole (DIFLUCAN) 150 MG tablet Take 1 tablet (150 mg total) by mouth daily. -For your yeast infection, start the Diflucan (fluconazole)- Take one pill today (day 1). If you're still having symptoms in 3 days, take the second pill.  If you're still having symptoms in 3 days, take the third pill. 09/12/21   Rhys Martini, PA-C  ibuprofen (ADVIL) 600 MG tablet Take 1 tablet (600 mg total) by mouth every 6 (six) hours as needed. Patient not taking: Reported on 10/27/2020 11/29/19   Burgess Amor, PA-C  ibuprofen (ADVIL) 600 MG tablet Take 1 tablet (600 mg total) by mouth every 6 (six) hours as needed. 10/11/20   Jacalyn Lefevre, MD  medroxyPROGESTERone (PROVERA) 5 MG tablet Take 1 tablet (5 mg total) by mouth daily. Patient not taking: Reported on 10/27/2020 10/11/20   Jacalyn Lefevre, MD  metroNIDAZOLE (FLAGYL) 500 MG tablet Take 1 tablet (500 mg total) by mouth 2 (two) times daily. 09/12/21   Rhys Martini, PA-C  Prenatal Vit-Fe Fumarate-FA (PNV  PRENATAL PLUS MULTIVITAMIN) 27-1 MG TABS Take 1 tablet by mouth daily. 10/27/20   Cresenzo-Dishmon, Scarlette Calico, CNM  tranexamic acid (LYSTEDA) 650 MG TABS tablet Take 2 tablets (1,300 mg total) by mouth 3 (three) times daily. 10/27/20   Cresenzo-Dishmon, Scarlette Calico, CNM      Allergies    Patient has no known allergies.    Review of Systems   Review of Systems  All other systems reviewed and are negative.  Physical Exam Updated Vital Signs BP 113/66   Pulse 73   Temp 99.4 F (37.4 C) (Oral)   Resp 19   LMP 03/13/2022 (Approximate)   SpO2 99%  Physical Exam Vitals and nursing note reviewed.  Constitutional:      General: She is not in acute distress.    Appearance: She is well-developed. She is obese.  HENT:     Head: Normocephalic and atraumatic.  Eyes:     Conjunctiva/sclera: Conjunctivae normal.  Cardiovascular:     Rate and Rhythm: Normal rate and regular rhythm.  Pulmonary:     Effort: Pulmonary effort is normal. No respiratory distress.     Breath sounds: Normal breath sounds. No stridor.  Chest:     Comments: Tenderness to palpation with guarding with pressure applied in the superior midline sternum Abdominal:     General: There is no distension.  Skin:    General: Skin is warm and dry.  Neurological:  Mental Status: She is alert and oriented to person, place, and time.     Cranial Nerves: No cranial nerve deficit.  Psychiatric:        Mood and Affect: Mood normal.    ED Results / Procedures / Treatments   Labs (all labs ordered are listed, but only abnormal results are displayed) Labs Reviewed  CBC - Abnormal; Notable for the following components:      Result Value   Platelets 454 (*)    All other components within normal limits  BASIC METABOLIC PANEL  I-STAT BETA HCG BLOOD, ED ([facility], [facility], [facility] ONLY)  TROPONIN I (HIGH SENSITIVITY)  TROPONIN I (HIGH SENSITIVITY)    EKG EKG Interpretation  Date/Time:  Wednesday Mar 21 2022 16:47:20 EDT Ventricular Rate:   86 PR Interval:  154 QRS Duration: 80 QT Interval:  380 QTC Calculation: 454 R Axis:   50 Text Interpretation: Normal sinus rhythm Baseline wander Otherwise within normal limits Confirmed by Gerhard Munch [phone]) on 03/21/2022 6:34:10 PM  Radiology DG Chest 2 View  Result Date: 03/21/2022 CLINICAL DATA:  LEFT side chest pain beginning 2-3 days ago worsened by deep breathing and moving LEFT arm EXAM: CHEST - 2 VIEW COMPARISON:  09/16/2019 FINDINGS: Normal heart size, mediastinal contours, and pulmonary vascularity. Lungs clear. No pleural effusion or pneumothorax. Bones unremarkable. IMPRESSION: Normal exam. Electronically Signed   By: Ulyses Southward M.D.   On: 03/21/2022 17:21    Procedures Procedures    Medications Ordered in ED Medications  ketorolac (TORADOL) 30 MG/ML injection 30 mg (30 mg Intravenous Given 03/21/22 1902)    ED Course/ Medical Decision Making/ A&P This patient with a Hx of obesity, cigarette addiction presents to the ED for concern of chest pain, this involves an extensive number of treatment options, and is a complaint that carries with it a high risk of complications and morbidity.    The differential diagnosis includes ACS, pneumonia, pneumothorax, musculoskeletal   Social Determinants of Health:  Right addiction, obesity    After the initial evaluation, orders, including: Labs x-ray ECG were initiated.   Patient placed on Cardiac and Pulse-Oximetry Monitors. The patient was maintained on a cardiac monitor.  The cardiac monitored showed an rhythm of 70 sinus normal The patient was also maintained on pulse oximetry. The readings were typically 100% room air normal   On repeat evaluation of the patient stayed the same  Lab Tests:  I personally interpreted labs.  The pertinent results include: 2 normal troponin  Imaging Studies ordered:  I independently visualized and interpreted imaging which showed no pneumonia I agree with the radiologist  interpretation   Dispostion / Final MDM:  After consideration of the diagnostic results and the patient's response to treatment, she is appropriate for discharge.  This young female presents with several days of chest pain.  Is not hypoxic or tachypneic, low suspicion for PE.  Patient is afebrile, has no x-ray evidence for pneumonia.  2 normal troponin, minimal risk profile for ACS all reassuring.  Further reassurance taken from the patient's physical exam consistent with inflammation, patient started on Toradol here, steroids at home, discharged in stable condition.  Final Clinical Impression(s) / ED Diagnoses Final diagnoses:  Atypical chest pain    Rx / DC Orders ED Discharge Orders          Ordered    predniSONE (DELTASONE) 20 MG tablet  Daily with breakfast        03/21/22 2052  Gerhard MunchLockwood, Jailen Lung, MD 03/21/22 2054

## 2022-03-21 NOTE — ED Provider Triage Note (Signed)
Emergency Medicine Provider Triage Evaluation Note  Veronica Watts , a 26 y.o. female  was evaluated in triage.  Pt complains of left-sided chest and shoulder pain.  Patient states the pain began approximately 2 to 3 days ago.  Pain is worse with movement.  No nausea, vomiting, abdominal pain.  No shortness of breath.  Patient denies any rigorous activity, new exercise program, prior to onset of pain.  Patient states she was sitting in a job interview at time of onset  Review of Systems  Positive: Chest pain, left shoulder pain Negative: Of breath, nausea  Physical Exam  BP 110/77 (BP Location: Left Arm)   Pulse 84   Temp 99.4 F (37.4 C) (Oral)   Resp 17   LMP 03/13/2022 (Approximate)   SpO2 100%  Gen:   Awake, no distress   Resp:  Normal effort  MSK:   Moves extremities without difficulty  Other:    Medical Decision Making  Medically screening exam initiated at 5:00 PM.  Appropriate orders placed.  Veronica Watts was informed that the remainder of the evaluation will be completed by another provider, this initial triage assessment does not replace that evaluation, and the importance of remaining in the ED until their evaluation is complete.     Darrick Grinder, PA-C 03/21/22 1701

## 2022-03-21 NOTE — ED Triage Notes (Signed)
Pt here for L side chest pain that started 2-3 days ago. Pt reports pain is worse w/ deep breaths, and when moving L arm. Pt also has pain in L shoulder. Denies N/V, fever/chills.

## 2022-03-21 NOTE — Discharge Instructions (Addendum)
As discussed, your evaluation today has been largely reassuring.  But, it is important that you monitor your condition carefully, and do not hesitate to return to the ED if you develop new, or concerning changes in your condition.  Your pain is likely due to inflammation of the chest wall.  Typically this improved with steroids  Otherwise, please follow-up with your physician for appropriate ongoing care.

## 2022-04-01 ENCOUNTER — Other Ambulatory Visit: Payer: Self-pay

## 2022-04-01 ENCOUNTER — Encounter (HOSPITAL_COMMUNITY): Payer: Self-pay | Admitting: Emergency Medicine

## 2022-04-01 ENCOUNTER — Emergency Department (HOSPITAL_COMMUNITY)
Admission: EM | Admit: 2022-04-01 | Discharge: 2022-04-01 | Discharge status: Against Medical Advice | Payer: Medicaid Other | Attending: Emergency Medicine | Admitting: Emergency Medicine

## 2022-04-01 DIAGNOSIS — Z5321 Procedure and treatment not carried out due to patient leaving prior to being seen by health care provider: Secondary | ICD-10-CM | POA: Diagnosis not present

## 2022-04-01 DIAGNOSIS — R110 Nausea: Secondary | ICD-10-CM | POA: Diagnosis not present

## 2022-04-01 DIAGNOSIS — R109 Unspecified abdominal pain: Secondary | ICD-10-CM | POA: Diagnosis not present

## 2022-04-01 LAB — CBC
HCT: 36.6 % (ref 36.0–46.0)
Hemoglobin: 11.6 g/dL — ABNORMAL LOW (ref 12.0–15.0)
MCH: 28.9 pg (ref 26.0–34.0)
MCHC: 31.7 g/dL (ref 30.0–36.0)
MCV: 91 fL (ref 80.0–100.0)
Platelets: 383 10*3/uL (ref 150–400)
RBC: 4.02 MIL/uL (ref 3.87–5.11)
RDW: 13.2 % (ref 11.5–15.5)
WBC: 7.2 10*3/uL (ref 4.0–10.5)
nRBC: 0 % (ref 0.0–0.2)

## 2022-04-01 LAB — COMPREHENSIVE METABOLIC PANEL
ALT: 14 U/L (ref 0–44)
AST: 15 U/L (ref 15–41)
Albumin: 3.4 g/dL — ABNORMAL LOW (ref 3.5–5.0)
Alkaline Phosphatase: 44 U/L (ref 38–126)
Anion gap: 8 (ref 5–15)
BUN: 10 mg/dL (ref 6–20)
CO2: 23 mmol/L (ref 22–32)
Calcium: 8.8 mg/dL — ABNORMAL LOW (ref 8.9–10.3)
Chloride: 106 mmol/L (ref 98–111)
Creatinine, Ser: 0.72 mg/dL (ref 0.44–1.00)
GFR, Estimated: >60 mL/min (ref >60)
Glucose, Bld: 96 mg/dL (ref 70–99)
Potassium: 3.9 mmol/L (ref 3.5–5.1)
Sodium: 137 mmol/L (ref 135–145)
Total Bilirubin: 0.4 mg/dL (ref 0.3–1.2)
Total Protein: 6.7 g/dL (ref 6.5–8.1)

## 2022-04-01 LAB — URINALYSIS, ROUTINE W REFLEX MICROSCOPIC
Bilirubin Urine: NEGATIVE
Glucose, UA: NEGATIVE mg/dL
Hgb urine dipstick: NEGATIVE
Ketones, ur: NEGATIVE mg/dL
Leukocytes,Ua: NEGATIVE
Nitrite: NEGATIVE
Protein, ur: NEGATIVE mg/dL
Specific Gravity, Urine: 1.025 (ref 1.005–1.030)
pH: 5 (ref 5.0–8.0)

## 2022-04-01 LAB — I-STAT BETA HCG BLOOD, ED (MC, WL, AP ONLY): I-stat hCG, quantitative: <5 m[IU]/mL (ref <5)

## 2022-04-01 LAB — LIPASE, BLOOD: Lipase: 25 U/L (ref 11–51)

## 2022-04-01 NOTE — ED Triage Notes (Signed)
Pt. Stated, I started to have stomach pain with nausea yesterday. Denies any other symptom

## 2022-04-01 NOTE — ED Notes (Signed)
Called pt x3 for vitals, no response. 

## 2022-04-01 NOTE — ED Notes (Signed)
Called pt x2 for vitals, no response. °

## 2022-12-08 ENCOUNTER — Encounter (HOSPITAL_COMMUNITY): Payer: Self-pay

## 2022-12-08 ENCOUNTER — Emergency Department (HOSPITAL_COMMUNITY)
Admission: EM | Admit: 2022-12-08 | Discharge: 2022-12-08 | Disposition: A | Discharge status: Home / Self Care | Payer: Medicaid Other | Attending: Emergency Medicine | Admitting: Emergency Medicine

## 2022-12-08 ENCOUNTER — Emergency Department (HOSPITAL_COMMUNITY): Payer: Medicaid Other

## 2022-12-08 ENCOUNTER — Other Ambulatory Visit: Payer: Self-pay

## 2022-12-08 DIAGNOSIS — S00201A Unspecified superficial injury of right eyelid and periocular area, initial encounter: Secondary | ICD-10-CM | POA: Diagnosis present

## 2022-12-08 DIAGNOSIS — S0012XA Contusion of left eyelid and periocular area, initial encounter: Secondary | ICD-10-CM | POA: Insufficient documentation

## 2022-12-08 DIAGNOSIS — S01111A Laceration without foreign body of right eyelid and periocular area, initial encounter: Secondary | ICD-10-CM | POA: Diagnosis not present

## 2022-12-08 DIAGNOSIS — S0990XA Unspecified injury of head, initial encounter: Secondary | ICD-10-CM | POA: Diagnosis not present

## 2022-12-08 DIAGNOSIS — R519 Headache, unspecified: Secondary | ICD-10-CM | POA: Diagnosis not present

## 2022-12-08 MED ORDER — HYDROCODONE-ACETAMINOPHEN 5-325 MG PO TABS
2.0000 | ORAL_TABLET | Freq: Once | ORAL | Status: AC
  Administered 2022-12-08: 2 via ORAL
  Filled 2022-12-08: qty 2

## 2022-12-08 MED ORDER — IBUPROFEN 800 MG PO TABS
800.0000 mg | ORAL_TABLET | Freq: Once | ORAL | Status: AC
  Administered 2022-12-08: 800 mg via ORAL
  Filled 2022-12-08: qty 1
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
## 2022-12-08 NOTE — ED Notes (Signed)
Pt resting in bed with no acute distress noted at this time.
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
## 2022-12-08 NOTE — Discharge Instructions (Signed)
Return if any problems.

## 2023-03-12 ENCOUNTER — Encounter: Payer: Self-pay | Admitting: Physician Assistant

## 2023-03-13 ENCOUNTER — Encounter: Payer: Self-pay | Admitting: Nurse Practitioner

## 2023-03-13 ENCOUNTER — Other Ambulatory Visit: Payer: Self-pay | Admitting: Nurse Practitioner

## 2023-03-13 DIAGNOSIS — N946 Dysmenorrhea, unspecified: Secondary | ICD-10-CM

## 2023-03-13 DIAGNOSIS — R102 Pelvic and perineal pain: Secondary | ICD-10-CM

## 2023-03-13 DIAGNOSIS — N920 Excessive and frequent menstruation with regular cycle: Secondary | ICD-10-CM

## 2023-03-19 ENCOUNTER — Other Ambulatory Visit: Payer: Medicaid Other

## 2023-04-19 ENCOUNTER — Other Ambulatory Visit: Payer: Medicaid Other

## 2023-04-29 ENCOUNTER — Ambulatory Visit
Admission: RE | Admit: 2023-04-29 | Discharge: 2023-04-29 | Disposition: A | Discharge status: Home / Self Care | Payer: Medicaid Other | Source: Ambulatory Visit | Attending: Nurse Practitioner | Admitting: Nurse Practitioner

## 2023-04-29 DIAGNOSIS — N946 Dysmenorrhea, unspecified: Secondary | ICD-10-CM

## 2023-04-29 DIAGNOSIS — N920 Excessive and frequent menstruation with regular cycle: Secondary | ICD-10-CM

## 2023-04-29 DIAGNOSIS — R102 Pelvic and perineal pain: Secondary | ICD-10-CM

## 2023-04-30 ENCOUNTER — Other Ambulatory Visit (HOSPITAL_COMMUNITY): Payer: Self-pay | Admitting: *Deleted

## 2023-04-30 DIAGNOSIS — M25561 Pain in right knee: Secondary | ICD-10-CM

## 2023-05-01 ENCOUNTER — Encounter (INDEPENDENT_AMBULATORY_CARE_PROVIDER_SITE_OTHER): Payer: Self-pay | Admitting: Family Medicine

## 2023-05-09 ENCOUNTER — Other Ambulatory Visit: Payer: Self-pay | Admitting: Nurse Practitioner

## 2023-05-09 DIAGNOSIS — R9389 Abnormal findings on diagnostic imaging of other specified body structures: Secondary | ICD-10-CM

## 2023-06-17 ENCOUNTER — Ambulatory Visit
Admission: RE | Admit: 2023-06-17 | Discharge: 2023-06-17 | Disposition: A | Discharge status: Home / Self Care | Payer: Medicaid Other | Source: Ambulatory Visit | Attending: Nurse Practitioner | Admitting: Nurse Practitioner

## 2023-06-17 DIAGNOSIS — R9389 Abnormal findings on diagnostic imaging of other specified body structures: Secondary | ICD-10-CM

## 2023-10-10 ENCOUNTER — Ambulatory Visit (HOSPITAL_COMMUNITY)
Admission: EM | Admit: 2023-10-10 | Discharge: 2023-10-10 | Disposition: A | Discharge status: Home / Self Care | Payer: Self-pay | Attending: Emergency Medicine | Admitting: Emergency Medicine

## 2023-10-10 ENCOUNTER — Encounter (HOSPITAL_COMMUNITY): Payer: Self-pay

## 2023-10-10 DIAGNOSIS — K0889 Other specified disorders of teeth and supporting structures: Secondary | ICD-10-CM

## 2023-10-10 DIAGNOSIS — K047 Periapical abscess without sinus: Secondary | ICD-10-CM

## 2023-10-10 MED ORDER — IBUPROFEN 800 MG PO TABS: 800.0000 mg | ORAL_TABLET | Freq: Three times a day (TID) | ORAL | 0 refills | Status: DC

## 2023-10-10 MED ORDER — AMOXICILLIN-POT CLAVULANATE 875-125 MG PO TABS: 1.0000 | ORAL_TABLET | Freq: Two times a day (BID) | ORAL | 0 refills | Status: AC
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
## 2023-10-10 NOTE — Discharge Instructions (Signed)
Please take medication as prescribed. Take with food to avoid upset stomach. Finish the full course! It might take 3-4 days to start working  In the meantime continue ibuprofen, orajel, and mouth rinses  Follow up with dentist as soon as able I recommend to [facility] dentists in the area who accept medicaid There is a small list of resources attached as well
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
## 2023-11-26 ENCOUNTER — Encounter (HOSPITAL_COMMUNITY): Payer: Self-pay

## 2023-11-26 ENCOUNTER — Ambulatory Visit (HOSPITAL_COMMUNITY)
Admission: EM | Admit: 2023-11-26 | Discharge: 2023-11-26 | Disposition: A | Discharge status: Home / Self Care | Payer: Self-pay | Attending: Emergency Medicine | Admitting: Emergency Medicine

## 2023-11-26 DIAGNOSIS — K029 Dental caries, unspecified: Secondary | ICD-10-CM | POA: Insufficient documentation

## 2023-11-26 DIAGNOSIS — S025XXA Fracture of tooth (traumatic), initial encounter for closed fracture: Secondary | ICD-10-CM

## 2023-11-26 DIAGNOSIS — K047 Periapical abscess without sinus: Secondary | ICD-10-CM

## 2023-11-26 MED ORDER — AMOXICILLIN-POT CLAVULANATE 875-125 MG PO TABS: 1.0000 | ORAL_TABLET | Freq: Two times a day (BID) | ORAL | 0 refills | Status: AC

## 2023-11-26 NOTE — Discharge Instructions (Addendum)
Please follow up with dental provider of your choice-call for appt. Take Augmentin  as prescribed.  May use over-the-counter Tylenol ibuprofen as label directed for any pain or fever, may use Orajel as label directed.

## 2023-11-26 NOTE — ED Triage Notes (Signed)
Pt c/o of left sided facial pain, dental pain, intermittent headaches, and gum swelling.  Start Date: Wednesday or Thursday of Last Week   Home Interventions: Saltwater Mouth Rinse, Ibuprofen, and Oragel
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
## 2023-12-20 ENCOUNTER — Encounter (HOSPITAL_COMMUNITY): Payer: Self-pay

## 2023-12-20 ENCOUNTER — Emergency Department (HOSPITAL_COMMUNITY)
Admission: EM | Admit: 2023-12-20 | Discharge: 2023-12-20 | Disposition: A | Discharge status: Home / Self Care | Payer: Self-pay | Attending: Emergency Medicine | Admitting: Emergency Medicine

## 2023-12-20 ENCOUNTER — Other Ambulatory Visit: Payer: Self-pay

## 2023-12-20 DIAGNOSIS — K029 Dental caries, unspecified: Secondary | ICD-10-CM | POA: Insufficient documentation

## 2023-12-20 MED ORDER — BENZOCAINE 10 % MT GEL
1.0000 | OROMUCOSAL | 0 refills | Status: AC | PRN
Start: 2023-12-20 — End: ?

## 2023-12-20 MED ORDER — IBUPROFEN 800 MG PO TABS: 800.0000 mg | ORAL_TABLET | Freq: Three times a day (TID) | ORAL | 0 refills | Status: DC

## 2023-12-20 NOTE — ED Triage Notes (Signed)
 Pt c/o pain in teeth in the entire left side of monthx9mos. Pt states she does not have a dentist.

## 2023-12-20 NOTE — ED Provider Notes (Signed)
 [place] EMERGENCY DEPARTMENT AT [facility] Provider Note   CSN: [id number] Arrival date & time: 12/20/23  1318     History  Chief Complaint  Patient presents with   Dental Pain    Veronica Watts is a 28 y.o. female, no pertinent past medical history, here for left-sided dental pain, this been going on for the last 2 months.  The past 2 months, she has had 3 different types of antibiotics given to her, and continues to have dental pain.  She has not followed up with a dentist.  She denies any facial swelling, redness, fevers, or chills.  Has been using Orajel, ibuprofen without relief.    Home Medications Prior to Admission medications   Medication Sig Start Date End Date Taking? Authorizing Provider  benzocaine (ORAJEL) 10 % mucosal gel Use as directed 1 Application in the mouth or throat as needed for mouth pain. 12/20/23  Yes Reyne Falconi L, PA  ibuprofen (ADVIL) 800 MG tablet Take 1 tablet (800 mg total) by mouth 3 (three) times daily. 12/20/23  Yes Sanaz Scarlett L, PA  amoxicillin-clavulanate (AUGMENTIN) 875-125 MG tablet Take 1 tablet by mouth every 12 (twelve) hours. 11/26/23   Defelice, Para March, NP      Allergies    Patient has no known allergies.    Review of Systems   Review of Systems  HENT:  Positive for dental problem. Negative for facial swelling.     Physical Exam Updated Vital Signs BP 139/82 (BP Location: Right Arm)   Pulse 78   Temp 98.9 F (37.2 C)   Resp 16   Ht 5\' 7"  (1.702 m)   Wt (!) 137 kg   LMP 11/24/2023 (Exact Date)   SpO2 99%   BMI 47.30 kg/m  Physical Exam Vitals and nursing note reviewed.  Constitutional:      General: She is not in acute distress.    Appearance: She is well-developed. She is not toxic-appearing.  HENT:     Head: Normocephalic and atraumatic.     Right Ear: Tympanic membrane is not perforated, erythematous, retracted or bulging.     Left Ear: Tympanic membrane is not perforated, erythematous, retracted  or bulging.     Nose: Nose normal.     Mouth/Throat:     Pharynx: Uvula midline. No oropharyngeal exudate, posterior oropharyngeal erythema or uvula swelling.     Tonsils: No tonsillar abscesses.      Comments: Dental caries as noted above.  No facial erythema, edema, or fluctuance of the mouth. Eyes:     General:        Right eye: No discharge.        Left eye: No discharge.     Conjunctiva/sclera: Conjunctivae normal.  Musculoskeletal:     Cervical back: Normal range of motion and neck supple.  Lymphadenopathy:     Cervical: No cervical adenopathy.  Neurological:     Mental Status: She is alert.  Psychiatric:        Behavior: Behavior normal.        Thought Content: Thought content normal.     ED Results / Procedures / Treatments   Labs (all labs ordered are listed, but only abnormal results are displayed) Labs Reviewed - No data to display  EKG None  Radiology No results found.  Procedures Procedures    Medications Ordered in ED Medications - No data to display  ED Course/ Medical Decision Making/ A&P  Medical Decision Making Patient is a 28 year old female, here for dental pain, has been going on for the last couple months, she has repeatedly not made an appointment with the dentist, and is repeatedly getting antibiotics.  Last given Augmentin, 2 weeks ago.  She states that her pain, is the left upper jaw, and it is not red, swollen, or have any other areas of fluctuance.  She repeatedly is getting antibiotics, without seeking care, I informed her that this will not resolve, until the teeth pulled.  I provided her information, for dentist, and Kendell Bane, that does free work, as well as multiple Radiographer, therapeutic, in the area.  I informed her she must get them taken care of, or the pain will recur.  She has no evidence of any kind of erythema, edema, or fluctuance, thus no antibiotic given at this time, as she has been on antibiotics  multiple times in the last few months, and this will not resolve her pain.  She was discharged home with strict return precautions.  Risk OTC drugs. Prescription drug management.    Final Clinical Impression(s) / ED Diagnoses Final diagnoses:  Pain due to dental caries    Rx / DC Orders ED Discharge Orders          Ordered    ibuprofen (ADVIL) 800 MG tablet  3 times daily        12/20/23 1349    benzocaine (ORAJEL) 10 % mucosal gel  As needed        12/20/23 1350              Larnie Heart, [place], [place] 12/20/23 1353    Jacalyn Lefevre, MD 12/20/23 [phone]

## 2023-12-20 NOTE — Discharge Instructions (Addendum)
 Your dental pain will not go away until you get your tooth pulled.  You have a dental infection, and need to see a dentist.  You have swelling of your face, redness, or fevers please return to the ER.  The antibiotics will not get rid of your dental pain, it will keep coming back, if you do not get it pulled.  This is not a definitive solution.  You will need to follow-up with a dentist.  This is very important, so the infection does not spread to your heart.

## 2024-05-28 ENCOUNTER — Encounter (HOSPITAL_COMMUNITY): Payer: Self-pay

## 2024-05-28 ENCOUNTER — Other Ambulatory Visit: Payer: Self-pay

## 2024-05-28 ENCOUNTER — Emergency Department (HOSPITAL_COMMUNITY)
Admission: EM | Admit: 2024-05-28 | Discharge: 2024-05-28 | Disposition: A | Discharge status: Home / Self Care | Payer: Self-pay | Attending: Emergency Medicine | Admitting: Emergency Medicine

## 2024-05-28 ENCOUNTER — Emergency Department (HOSPITAL_COMMUNITY): Payer: Self-pay

## 2024-05-28 DIAGNOSIS — M25511 Pain in right shoulder: Secondary | ICD-10-CM | POA: Insufficient documentation

## 2024-05-28 DIAGNOSIS — M7581 Other shoulder lesions, right shoulder: Secondary | ICD-10-CM | POA: Insufficient documentation

## 2024-05-28 DIAGNOSIS — Y990 Civilian activity done for income or pay: Secondary | ICD-10-CM | POA: Insufficient documentation

## 2024-05-28 DIAGNOSIS — X501XXA Overexertion from prolonged static or awkward postures, initial encounter: Secondary | ICD-10-CM | POA: Insufficient documentation

## 2024-05-28 MED ORDER — MELOXICAM 15 MG PO TABS
15.0000 mg | ORAL_TABLET | Freq: Every day | ORAL | 0 refills | Status: AC
Start: 2024-05-28 — End: ?

## 2024-05-28 NOTE — ED Provider Notes (Signed)
 I saw and evaluated the patient, reviewed the resident's note and I agree with the findings and plan.   28 year old female presents with right shoulder pain.  Does a lot of of lifting with her right arm.  Will check x-rays patient likely has rotator cuff issues and will discharge   Dasie Faden, MD 05/28/24 1611

## 2024-05-28 NOTE — ED Triage Notes (Signed)
 Patient said her right shoulder is hurting. This has been going on for 2 weeks. Denies falls. Unable to raise arm today. Does a lot of pulling patients at work.

## 2024-05-28 NOTE — Discharge Instructions (Addendum)
 Please review the attached information regarding some home stretches and exercises you can do for your shoulder. It seems like you have some inflammation in your rotator cuff. Try to avoid excessive overhead lifting or other overhead activities.  I recommend following up closely with your PCP who may consider formal physical therapy and/or a sports medicine referral.   In the meantime, you can use Meloxicam  once daily to help with your pain. This has been sent to your pharmacy. After taking this for 7 days you can use this as needed or switch to Aleve  (naproxen ). Do not take ibuprofen  or Aleve  while taking meloxicam , and remember to take it after breakfast.  Please seek medical attention if you develop any numbness or weakness in your arm or hands, or if you have any chest pain or difficulty breathing.

## 2024-05-28 NOTE — ED Provider Notes (Signed)
 [place] EMERGENCY DEPARTMENT AT [facility] Provider Note   CSN: [id number] Arrival date & time: 05/28/24  1526     Patient presents with: Shoulder Pain   Veronica Watts is a 28 y.o. female.   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  She denies any recent injury or falls.  She does not play sports or do overhead exercises outside of work. Endorses occasional shooting pains down her arm into her hands.  Her pain is worse with abduction past 90 degrees Denies any numbness or weakness in her hand/arm.  Denies fevers, abdominal pain, chest pain, shortness of breath Denies neck pain, back pain   Shoulder Pain Associated symptoms: no back pain, no fever and no neck pain        Prior to Admission medications   Medication Sig Start Date End Date Taking? Authorizing Provider  meloxicam  (MOBIC ) 15 MG tablet Take 1 tablet (15 mg total) by mouth daily. x 7 days then daily as needed 05/28/24  Yes Romelle Booty, MD  amoxicillin -clavulanate (AUGMENTIN ) 875-125 MG tablet Take 1 tablet by mouth every 12 (twelve) hours. 11/26/23   Defelice, Rilla, NP  benzocaine  (ORAJEL) 10 % mucosal gel Use as directed 1 Application in the mouth or throat as needed for mouth pain. 12/20/23   Small, Brooke L, PA    Allergies: Patient has no known allergies.    Review of Systems  Constitutional:  Negative for fever.  Respiratory:  Negative for shortness of breath.   Cardiovascular:  Negative for chest pain.  Gastrointestinal:  Negative for abdominal pain.  Musculoskeletal:  Negative for back pain, neck pain and neck stiffness.  Neurological:  Negative for weakness, numbness and headaches.  Otherwise per HPI  Updated Vital Signs BP 122/88   Pulse 100   Temp 98.2 F (36.8 C) (Oral)   Resp 19   Ht 5' 7 (1.702 m)   Wt (!)  178.3 kg   LMP 05/03/2024   SpO2 100%   BMI 61.55 kg/m   Physical Exam Constitutional:      General: She is not in acute distress. HENT:     Head: Normocephalic and atraumatic.  Pulmonary:     Effort: Pulmonary effort is normal. No respiratory distress.  Abdominal:     General: There is no distension.  Musculoskeletal:     Cervical back: Normal range of motion and neck supple. No rigidity or tenderness.     Comments: R shoulder: Inspection: No gross deformity, ecchymosis, swelling Palpation: Moderately tender to palpation over AC joint.  Nontender to palpation throughout remainder of clavicle, scapula, humeral head, humerus ROM: Restricted ROM with abduction past 90 degrees.  Able to abduct with assistance.  Normal external and internal rotation. Strength: 5/5 grip strength and biceps/triceps strength Special tests: Positive Hawkins, Neer's, empty can NVI distally with normal sensation and radial pulse, cap refill  Neurological:     General: No focal deficit present.     Mental Status: She is alert. Mental status is at baseline.     (all labs ordered are listed, but only abnormal results are displayed) Labs Reviewed - No data to display  EKG: None  Radiology: DG Shoulder Right Result Date: 05/28/2024 CLINICAL DATA:  Right shoulder pain for 2 weeks EXAM: RIGHT SHOULDER - 2+ VIEW COMPARISON:  None Available. FINDINGS: Frontal, transscapular,  and axillary views of the right shoulder are obtained. No fracture, subluxation, or dislocation. Joint spaces are well preserved. Soft tissues are unremarkable. Right chest is clear. IMPRESSION: 1. Unremarkable right shoulder. Electronically Signed   By: Ozell Daring M.D.   On: 05/28/2024 16:17     Procedures   Medications Ordered in the ED - No data to display                                  Medical Decision Making 28 year old female with no significant past medical history presenting due to right-sided shoulder pain with  intermittent radiation into arm/hand Some tenderness over [facility] joint, possible sprain Positive impingement testing consistent with likely rotator cuff tendinitis Lower suspicion for full-thickness tear given lack of injury and preserved strength.  Low suspicion for fracture or bony injury; X-ray ordered in triage negative.  Feel patient would benefit most from NSAIDs and physical therapy Plan to discharge with meloxicam  then can use OTC Aleve /ibuprofen  Will provide home rehab exercises for rotator cuff tendinitis Recommend outpatient follow-up with PCP. Consider PT referral and/or sports medicine eval.   Amount and/or Complexity of Data Reviewed Radiology: ordered.  Risk Prescription drug management.        Final diagnoses:  Right shoulder pain, unspecified chronicity  Tendinitis of right rotator cuff    ED Discharge Orders          Ordered    meloxicam  (MOBIC ) 15 MG tablet  Daily        05/28/24 1705               Romelle Booty, MD 05/28/24 1707    Dasie Faden, MD 05/28/24 2201

## 2024-10-29 ENCOUNTER — Emergency Department (HOSPITAL_COMMUNITY): Payer: Self-pay

## 2024-10-29 ENCOUNTER — Encounter (HOSPITAL_COMMUNITY): Payer: Self-pay

## 2024-10-29 ENCOUNTER — Other Ambulatory Visit: Payer: Self-pay

## 2024-10-29 ENCOUNTER — Emergency Department (HOSPITAL_COMMUNITY)
Admission: EM | Admit: 2024-10-29 | Discharge: 2024-10-29 | Disposition: A | Discharge status: Home / Self Care | Payer: Self-pay | Attending: Emergency Medicine | Admitting: Emergency Medicine

## 2024-10-29 DIAGNOSIS — W1830XA Fall on same level, unspecified, initial encounter: Secondary | ICD-10-CM | POA: Insufficient documentation

## 2024-10-29 DIAGNOSIS — S93401A Sprain of unspecified ligament of right ankle, initial encounter: Secondary | ICD-10-CM | POA: Insufficient documentation

## 2024-10-29 MED ORDER — IBUPROFEN 400 MG PO TABS
600.0000 mg | ORAL_TABLET | Freq: Once | ORAL | Status: AC
  Administered 2024-10-29: 600 mg via ORAL
  Filled 2024-10-29: qty 1

## 2024-10-29 NOTE — Discharge Instructions (Addendum)
 Your x-ray shows no fractures, you have an ankle sprain.  Use ankle brace or cam walker boot for support.  You can use crutches as needed while it is painful to bear weight but as pain improves you can bear weight as tolerated.  Use ibuprofen  600 mg and Tylenol  650 mg every 6 hours as needed for pain and swelling.  You can also elevate and ice the leg to help with pain and swelling as well.  Follow-up with orthopedics if symptoms or not improving in the next 4 to 5 days.

## 2024-10-29 NOTE — ED Triage Notes (Signed)
 PT arrives via POV. PT reports injuring her right ankle last night. Pt AxOx4.

## 2024-10-29 NOTE — ED Provider Notes (Signed)
 " [place] EMERGENCY DEPARTMENT AT [facility] Provider Note   CSN: [id number] Arrival date & time: 10/29/24  9053     Patient presents with: Ankle Injury   Veronica Watts is a 29 y.o. female.   Veronica Watts is a 29 y.o. female otherwise healthy, presenting for right ankle pain and swelling.  Patient reports last night she was walking to the store and on her way home her ankle rolled and she fell.  No other injuries during the fall.  Reports last night thought she would be okay but this morning woke up with increasing pain and swelling of the ankle and difficulty bearing weight.  Did not try any medications prior to arrival.  Does not have any prior history of foot or ankle injury.  No other aggravating relieving factors.  The history is provided by the patient, a significant other and medical records.  Ankle Injury       Prior to Admission medications  Medication Sig Start Date End Date Taking? Authorizing Provider  amoxicillin -clavulanate (AUGMENTIN ) 875-125 MG tablet Take 1 tablet by mouth every 12 (twelve) hours. 11/26/23   Defelice, Rilla, NP  benzocaine  (ORAJEL) 10 % mucosal gel Use as directed 1 Application in the mouth or throat as needed for mouth pain. 12/20/23   Small, Brooke L, PA  meloxicam  (MOBIC ) 15 MG tablet Take 1 tablet (15 mg total) by mouth daily. x 7 days then daily as needed 05/28/24   Romelle Booty, MD    Allergies: Patient has no known allergies.    Review of Systems  Constitutional:  Negative for chills and fever.  Musculoskeletal:  Positive for arthralgias and joint swelling.    Updated Vital Signs BP 114/64   Pulse 100   Temp 98.3 F (36.8 C) (Oral)   Resp 17   SpO2 98%   Physical Exam Vitals and nursing note reviewed.  Constitutional:      General: She is not in acute distress.    Appearance: Normal appearance. She is well-developed. She is not ill-appearing or diaphoretic.  HENT:     Head: Normocephalic and atraumatic.   Eyes:     General:        Right eye: No discharge.        Left eye: No discharge.  Pulmonary:     Effort: Pulmonary effort is normal. No respiratory distress.  Musculoskeletal:     Comments: Presents for a right lateral malleolus with tenderness in the region of the ATFL, no tenderness of the distal forefoot or over the fifth metatarsal, no medial malleoli or tenderness or tenderness over the calcaneus.  No bruising or deformity over the top of the foot.  Distal pulses 2+, able to move toes without difficulty.  Neurological:     Mental Status: She is alert and oriented to person, place, and time.     Coordination: Coordination normal.  Psychiatric:        Mood and Affect: Mood normal.        Behavior: Behavior normal.     (all labs ordered are listed, but only abnormal results are displayed) Labs Reviewed - No data to display  EKG: None  Radiology: DG Ankle Complete Right Result Date: 10/29/2024 CLINICAL DATA:  Status post fall. EXAM: RIGHT ANKLE - COMPLETE 3+ VIEW COMPARISON:  None Available. FINDINGS: There is no evidence of fracture, dislocation, or joint effusion. There is no evidence of arthropathy or other focal bone abnormality. Moderate severity lateral soft tissue swelling  is seen. IMPRESSION: Lateral soft tissue swelling without evidence of acute fracture or dislocation. Electronically Signed   By: Suzen Dials M.D.   On: 10/29/2024 11:28     Procedures   Medications Ordered in the ED  ibuprofen  (ADVIL ) tablet 600 mg (600 mg Oral Given 10/29/24 1101)                                    Medical Decision Making  Presentation consistent with ankle sprain. Tenderness and swelling over lateral malleolus, pt is neurovascularly intact. X-ray viewed by myself, agree with radiologist findings, negative for fracture, and shows ankle mortise is intact. Pain treated in the ED. Pt placed in cam boot as ASO brace did not fit patient comfortably and provided crutches, ambulated  without difficulty. Pt stable for discharge home with NSAIDs & RICE therapy. Pt to follow-up with ortho in one week if symptoms not improving. Return precautions discussed, Pt expresses understanding and agrees with plan.      Final diagnoses:  Sprain of right ankle, unspecified ligament, initial encounter    ED Discharge Orders     None          Veronica Watts 10/29/24 1616    Veronica Jerilynn RAMAN, MD 10/29/24 [phone]  "

## 2024-10-29 NOTE — ED Provider Triage Note (Signed)
 Emergency Medicine Provider Triage Evaluation Note  ZANNAH MELUCCI , a 29 y.o. female  was evaluated in triage.  Pt complains of R ankle pain after fall.  Review of Systems  Positive: Ankle pain Negative: Foot pain, other injury   Physical Exam  BP 114/64   Pulse 100   Temp 98.3 F (36.8 C) (Oral)   Resp 17   SpO2 98%  Gen:   Awake, no distress   Resp:  Normal effort  MSK:   Moves extremities without difficulty  Other:  Swelling /ttp over R lateral malleolus   Medical Decision Making  Medically screening exam initiated at 10:55 AM.  Appropriate orders placed.  MORAYO LEVEN was informed that the remainder of the evaluation will be completed by another provider, this initial triage assessment does not replace that evaluation, and the importance of remaining in the ED until their evaluation is complete.     Francesca Elsie CROME, MD 10/29/24 1056
